# Patient Record
Sex: Female | Born: 1960 | Race: Black or African American | Hispanic: No | Marital: Married
Health system: Southern US, Community
[De-identification: ages and names within clinical notes are randomized; demographics above are authoritative.]

## PROBLEM LIST (undated history)

## (undated) DIAGNOSIS — I10 Essential (primary) hypertension: Secondary | ICD-10-CM

## (undated) HISTORY — PX: BREAST BIOPSY: SHX20

---

## 2004-08-25 ENCOUNTER — Ambulatory Visit: Payer: Self-pay | Admitting: Family Medicine

## 2005-12-20 ENCOUNTER — Ambulatory Visit: Payer: Self-pay | Admitting: Family Medicine

## 2006-12-25 ENCOUNTER — Ambulatory Visit: Payer: Self-pay | Admitting: Family Medicine

## 2006-12-27 ENCOUNTER — Ambulatory Visit: Payer: Self-pay | Admitting: Family Medicine

## 2007-01-11 ENCOUNTER — Ambulatory Visit: Payer: Self-pay | Admitting: General Surgery

## 2007-01-11 ENCOUNTER — Other Ambulatory Visit: Payer: Self-pay

## 2007-01-15 ENCOUNTER — Ambulatory Visit: Payer: Self-pay | Admitting: General Surgery

## 2007-12-28 ENCOUNTER — Ambulatory Visit: Payer: Self-pay | Admitting: Family Medicine

## 2013-03-12 ENCOUNTER — Ambulatory Visit: Payer: Self-pay | Admitting: Family Medicine

## 2014-12-07 ENCOUNTER — Emergency Department
Admit: 2014-12-07 | Disposition: A | Discharge status: Home / Self Care | Payer: Self-pay | Admitting: Emergency Medicine

## 2015-01-16 ENCOUNTER — Ambulatory Visit: Payer: PRIVATE HEALTH INSURANCE | Admitting: Anesthesiology

## 2015-01-16 ENCOUNTER — Encounter: Payer: Self-pay | Admitting: *Deleted

## 2015-01-16 ENCOUNTER — Encounter
Admission: RE | Disposition: A | Discharge status: Home / Self Care | Payer: Self-pay | Source: Ambulatory Visit | Attending: Gastroenterology

## 2015-01-16 ENCOUNTER — Ambulatory Visit
Admission: RE | Admit: 2015-01-16 | Discharge: 2015-01-16 | Disposition: A | Discharge status: Home / Self Care | Payer: PRIVATE HEALTH INSURANCE | Source: Ambulatory Visit | Attending: Gastroenterology | Admitting: Gastroenterology

## 2015-01-16 DIAGNOSIS — K5730 Diverticulosis of large intestine without perforation or abscess without bleeding: Secondary | ICD-10-CM | POA: Insufficient documentation

## 2015-01-16 DIAGNOSIS — Z1211 Encounter for screening for malignant neoplasm of colon: Secondary | ICD-10-CM | POA: Insufficient documentation

## 2015-01-16 DIAGNOSIS — Z79899 Other long term (current) drug therapy: Secondary | ICD-10-CM | POA: Insufficient documentation

## 2015-01-16 DIAGNOSIS — Z791 Long term (current) use of non-steroidal anti-inflammatories (NSAID): Secondary | ICD-10-CM | POA: Insufficient documentation

## 2015-01-16 DIAGNOSIS — D127 Benign neoplasm of rectosigmoid junction: Secondary | ICD-10-CM | POA: Insufficient documentation

## 2015-01-16 DIAGNOSIS — I10 Essential (primary) hypertension: Secondary | ICD-10-CM | POA: Insufficient documentation

## 2015-01-16 HISTORY — PX: COLONOSCOPY: SHX5424

## 2015-01-16 HISTORY — DX: Essential (primary) hypertension: I10

## 2015-01-16 SURGERY — COLONOSCOPY
Anesthesia: General

## 2015-01-16 MED ORDER — MIDAZOLAM HCL 2 MG/2ML IJ SOLN
INTRAMUSCULAR | Status: DC | PRN
  Administered 2015-01-16: 1 mg via INTRAVENOUS

## 2015-01-16 MED ORDER — PROPOFOL INFUSION 10 MG/ML OPTIME
INTRAVENOUS | Status: DC | PRN
  Administered 2015-01-16: 100 ug/kg/min via INTRAVENOUS

## 2015-01-16 MED ORDER — SODIUM CHLORIDE 0.9 % IV SOLN
INTRAVENOUS | Status: DC
  Administered 2015-01-16: 08:00:00 via INTRAVENOUS

## 2015-01-16 MED ORDER — FENTANYL CITRATE (PF) 100 MCG/2ML IJ SOLN
INTRAMUSCULAR | Status: DC | PRN
  Administered 2015-01-16: 50 ug via INTRAVENOUS

## 2015-01-16 MED ORDER — PROPOFOL 10 MG/ML IV BOLUS
INTRAVENOUS | Status: DC | PRN
  Administered 2015-01-16 (×2): 20 mg via INTRAVENOUS

## 2015-01-16 NOTE — H&P (Signed)
  Primary Care Physician:  Petra Kuba, MD  Pre-Procedure History & Physical: HPI:  Savannah Stuart is a 54 y.o. female is here for an colonoscopy.   Past Medical History  Diagnosis Date  . Hypertension     No past surgical history on file.  Prior to Admission medications   Medication Sig Start Date End Date Taking? Authorizing Provider  hydrochlorothiazide (HYDRODIURIL) 25 MG tablet Take 25 mg by mouth daily.   Yes Historical Provider, MD  baclofen (LIORESAL) 10 MG tablet Take 1 tablet by mouth 3 (three) times daily as needed. Shoulder pain 12/07/14   Historical Provider, MD  doxycycline (VIBRAMYCIN) 100 MG capsule  12/27/14   Historical Provider, MD  ibuprofen (ADVIL,MOTRIN) 800 MG tablet Take 1 tablet by mouth 3 (three) times daily as needed. Pain 12/07/14   Historical Provider, MD  ONEXTON 1.2-3.75 % GEL Apply 1 application topically. 12/30/14   Historical Provider, MD    Allergies as of 12/11/2014  . (Not on File)    No family history on file.  History   Social History  . Marital Status: Married    Spouse Name: N/A  . Number of Children: N/A  . Years of Education: N/A   Occupational History  . Not on file.   Social History Main Topics  . Smoking status: Not on file  . Smokeless tobacco: Not on file  . Alcohol Use: Not on file  . Drug Use: Not on file  . Sexual Activity: Not on file   Other Topics Concern  . Not on file   Social History Narrative  . No narrative on file     Physical Exam: BP 126/66 mmHg  Pulse 77  Temp(Src) 96.7 F (35.9 C) (Tympanic)  Resp 16  Ht 5\' 3"  (1.6 m)  Wt 187 lb (84.823 kg)  BMI 33.13 kg/m2  SpO2 99% General:   Alert,  pleasant and cooperative in NAD Head:  Normocephalic and atraumatic. Neck:  Supple; no masses or thyromegaly. Lungs:  Clear throughout to auscultation.    Heart:  Regular rate and rhythm. Abdomen:  Soft, nontender and nondistended. Normal bowel sounds, without guarding, and without rebound.    Neurologic:  Alert and  oriented x4;  grossly normal neurologically.  Impression/Plan: Savannah Stuart is here for an colonoscopy to be performed for colon cancer prevention  Risks, benefits, limitations, and alternatives regarding  colonoscopy have been reviewed with the patient.  Questions have been answered.  All parties agreeable.   Josefine Class, MD  01/16/2015, 8:04 AM

## 2015-01-16 NOTE — Anesthesia Preprocedure Evaluation (Signed)
Anesthesia Evaluation  Patient identified by MRN, date of birth, ID band Patient awake    Reviewed: Allergy & Precautions, NPO status , Patient's Chart, lab work & pertinent test results  Airway Mallampati: II  TM Distance: >3 FB Neck ROM: Full    Dental  (+) Teeth Intact   Pulmonary          Cardiovascular hypertension, Pt. on medications     Neuro/Psych    GI/Hepatic   Endo/Other    Renal/GU      Musculoskeletal   Abdominal   Peds  Hematology   Anesthesia Other Findings   Reproductive/Obstetrics                             Anesthesia Physical Anesthesia Plan  ASA: II  Anesthesia Plan: General   Post-op Pain Management:    Induction: Intravenous  Airway Management Planned: Nasal Cannula  Additional Equipment:   Intra-op Plan:   Post-operative Plan:   Informed Consent: I have reviewed the patients History and Physical, chart, labs and discussed the procedure including the risks, benefits and alternatives for the proposed anesthesia with the patient or authorized representative who has indicated his/her understanding and acceptance.     Plan Discussed with:   Anesthesia Plan Comments:         Anesthesia Quick Evaluation

## 2015-01-16 NOTE — Op Note (Signed)
[facility] Patient Name: Savannah Stuart Procedure Date: 01/16/2015 8:07 AM MRN: [id number] Account #: [id number] Date of Birth: 1960-09-26 Admit Type: Outpatient Age: 54 Room: [facility] ENDO ROOM 1 Gender: Female Note Status: Finalized Procedure:         Colonoscopy Indications:       Screening for colorectal malignant neoplasm, This is the                     patient's first colonoscopy Patient Profile:   This is a 54 year old female. Providers:         Gerrit Heck. Rayann Heman, MD Referring MD:      Tressia Danas, MD (Referring MD) Medicines:         Propofol per Anesthesia Complications:     No immediate complications. Procedure:         Pre-Anesthesia Assessment:                    - Prior to the procedure, a History and Physical was                     performed, and patient medications and allergies were                     reviewed. The patient is competent. The risks and benefits                     of the procedure and the sedation options and risks were                     discussed with the patient. All questions were answered                     and informed consent was obtained. Patient identification                     and proposed procedure were verified by the physician and                     the nurse in the pre-procedure area. Mental Status                     Examination: alert and oriented. Airway Examination:                     normal oropharyngeal airway and neck mobility. Respiratory                     Examination: clear to auscultation. CV Examination: RRR,                     no murmurs, no S3 or S4. Prophylactic Antibiotics: The                     patient does not require prophylactic antibiotics. Prior                     Anticoagulants: The patient has taken no previous                     anticoagulant or antiplatelet agents. ASA Grade                     Assessment: II - A patient with mild systemic disease.  After reviewing the risks and benefits, the patient was                     deemed in satisfactory condition to undergo the procedure.                     The anesthesia plan was to use monitored anesthesia care                     (MAC). Immediately prior to administration of medications,                     the patient was re-assessed for adequacy to receive                     sedatives. The heart rate, respiratory rate, oxygen                     saturations, blood pressure, adequacy of pulmonary                     ventilation, and response to care were monitored                     throughout the procedure. The physical status of the                     patient was re-assessed after the procedure.                    - Prior to the procedure, a History and Physical was                     performed, and patient medications, allergies and                     sensitivities were reviewed. The patient's tolerance of                     previous anesthesia was reviewed.                    After obtaining informed consent, the colonoscope was                     passed under direct vision. Throughout the procedure, the                     patient's blood pressure, pulse, and oxygen saturations                     were monitored continuously. The Colonoscope was                     introduced through the anus and advanced to the the                     terminal ileum. The colonoscopy was performed without                     difficulty. The patient tolerated the procedure well. The                     quality of the bowel preparation was excellent. Findings:      The perianal and digital rectal examinations were normal.      A few  large-mouthed diverticula were found in the sigmoid colon.      A 2 mm polyp was found in the recto-sigmoid colon. The polyp was       sessile. The polyp was removed with a jumbo cold forceps. Resection and       retrieval were complete.      The exam was  otherwise without abnormality on direct and retroflexion       views. Impression:        - Diverticulosis in the sigmoid colon.                    - One 2 mm polyp at the recto-sigmoid colon. Resected and                     retrieved.                    - The examination was otherwise normal on direct and                     retroflexion views. Recommendation:    - Observe patient in GI recovery unit.                    - High fiber diet.                    - Continue present medications.                    - Await pathology results.                    - Repeat colonoscopy for screening / surveillance based on                     pathology results, 5 years if adenomatous, otherwise                     repeat in 10 years.                    - The findings and recommendations were discussed with the                     patient.                    - Return to referring physician.                    - The findings and recommendations were discussed with the                     patient.                    - The findings and recommendations were discussed with the                     patient's family. Procedure Code(s): --- Professional ---                    [phone], Colonoscopy, flexible; with biopsy, single or                     multiple CPT copyright 2014 American Medical Association. All rights reserved. The codes documented in this report are preliminary and upon coder review may  be revised to  meet current compliance requirements. Mellody Life, MD 01/16/2015 8:34:34 AM This report has been signed electronically. Number of Addenda: 0 Note Initiated On: 01/16/2015 8:07 AM Scope Withdrawal Time: 0 hours 11 minutes 8 seconds  Total Procedure Duration: 0 hours 15 minutes 23 seconds       [facility]

## 2015-01-16 NOTE — Anesthesia Postprocedure Evaluation (Signed)
  Anesthesia Post-op Note  Patient: Savannah Stuart  Procedure(s) Performed: Procedure(s): COLONOSCOPY (N/A)  Anesthesia type:General  Patient location: PACU  Post pain: Pain level controlled  Post assessment: Post-op Vital signs reviewed, Patient's Cardiovascular Status Stable, Respiratory Function Stable, Patent Airway and No signs of Nausea or vomiting  Post vital signs: Reviewed and stable  Last Vitals:  Filed Vitals:   01/16/15 0844  BP:   Pulse:   Temp: 36 C  Resp:     Level of consciousness: awake, alert  and patient cooperative  Complications: No apparent anesthesia complications

## 2015-01-16 NOTE — Transfer of Care (Signed)
Immediate Anesthesia Transfer of Care Note  Patient: Savannah Stuart  Procedure(s) Performed: Procedure(s): COLONOSCOPY (N/A)  Patient Location: PACU  Anesthesia Type:General  Level of Consciousness: awake, alert  and oriented  Airway & Oxygen Therapy: Patient Spontanous Breathing  Post-op Assessment: Report given to RN  Post vital signs: Reviewed and stable  Last Vitals:  Filed Vitals:   01/16/15 0745  BP: 126/66  Pulse: 77  Temp: 35.9 C  Resp: 16    Complications: No apparent anesthesia complications

## 2015-01-16 NOTE — Discharge Instructions (Signed)

## 2015-01-20 ENCOUNTER — Other Ambulatory Visit: Payer: Self-pay | Admitting: Family Medicine

## 2015-01-20 DIAGNOSIS — Z1231 Encounter for screening mammogram for malignant neoplasm of breast: Secondary | ICD-10-CM

## 2015-01-20 LAB — SURGICAL PATHOLOGY

## 2015-01-27 ENCOUNTER — Encounter: Payer: Self-pay | Admitting: Gastroenterology

## 2015-02-12 ENCOUNTER — Ambulatory Visit
Admission: RE | Admit: 2015-02-12 | Discharge: 2015-02-12 | Disposition: A | Discharge status: Home / Self Care | Payer: PRIVATE HEALTH INSURANCE | Source: Ambulatory Visit | Attending: Family Medicine | Admitting: Family Medicine

## 2015-02-12 DIAGNOSIS — Z1231 Encounter for screening mammogram for malignant neoplasm of breast: Secondary | ICD-10-CM | POA: Diagnosis present

## 2016-03-02 ENCOUNTER — Other Ambulatory Visit: Payer: Self-pay | Admitting: Family Medicine

## 2016-03-02 DIAGNOSIS — Z1231 Encounter for screening mammogram for malignant neoplasm of breast: Secondary | ICD-10-CM

## 2016-03-17 ENCOUNTER — Ambulatory Visit
Admission: RE | Admit: 2016-03-17 | Discharge: 2016-03-17 | Disposition: A | Discharge status: Home / Self Care | Payer: PRIVATE HEALTH INSURANCE | Source: Ambulatory Visit | Attending: Family Medicine | Admitting: Family Medicine

## 2016-03-17 DIAGNOSIS — Z1231 Encounter for screening mammogram for malignant neoplasm of breast: Secondary | ICD-10-CM | POA: Diagnosis not present

## 2017-01-23 IMAGING — CR DG SHOULDER 3+V*R*
1 series · 3 of 3 positions shown · non-contrast
Comparison: None.

CLINICAL DATA: 54-year-old female with pain and decreased range of
motion. Initial encounter.

EXAM:
DG SHOULDER 3+ VIEWS RIGHT

[Series 1: dxr shoulder right complete · 0.14mm/px · 3 of 3 slices shown]
[im 1/3]
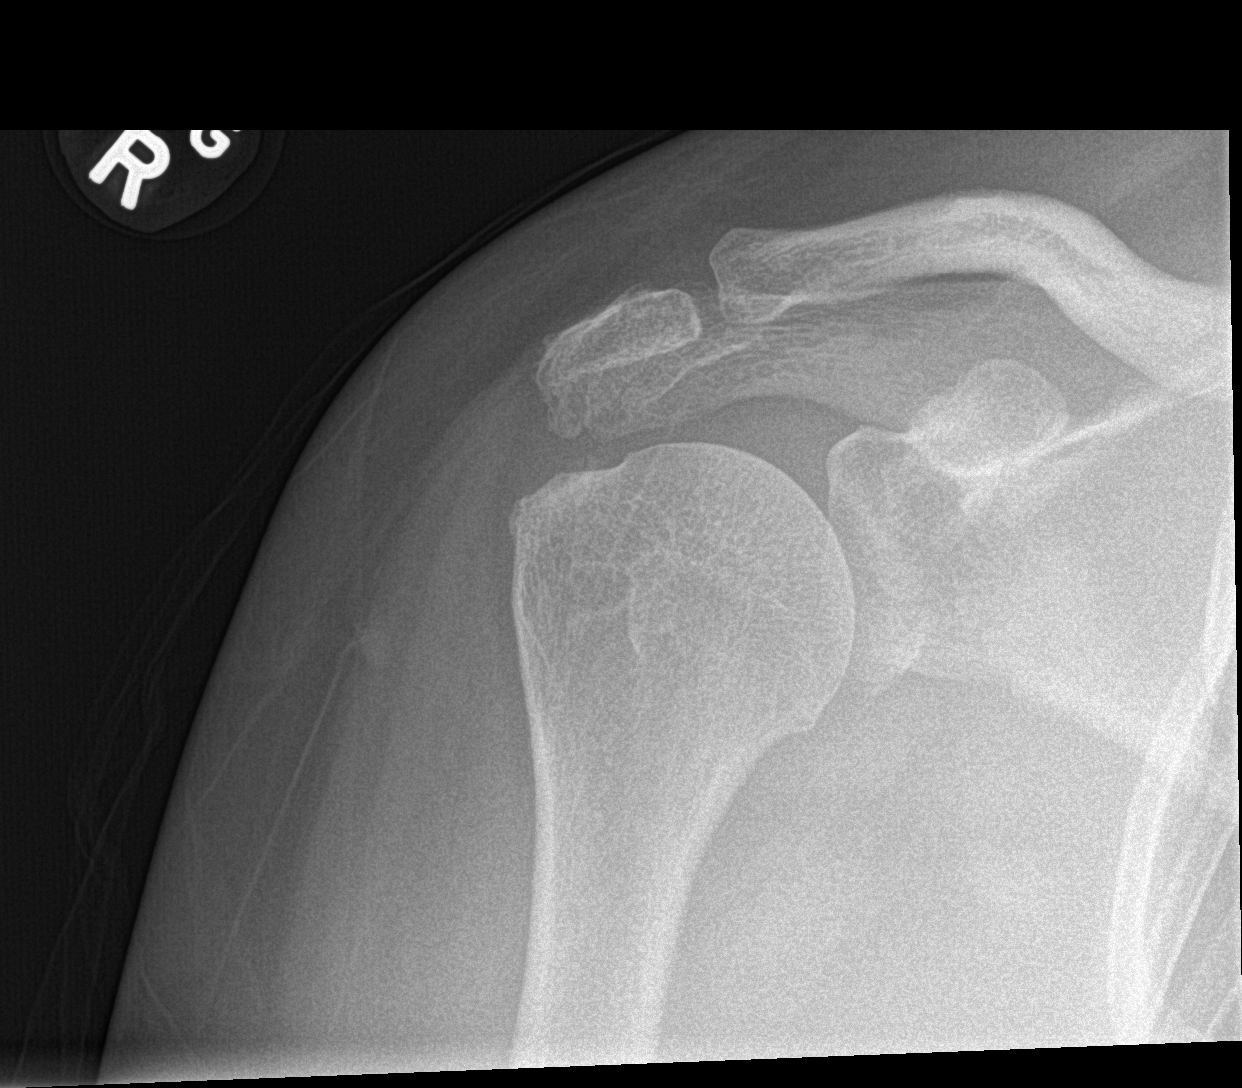
[im 2/3]
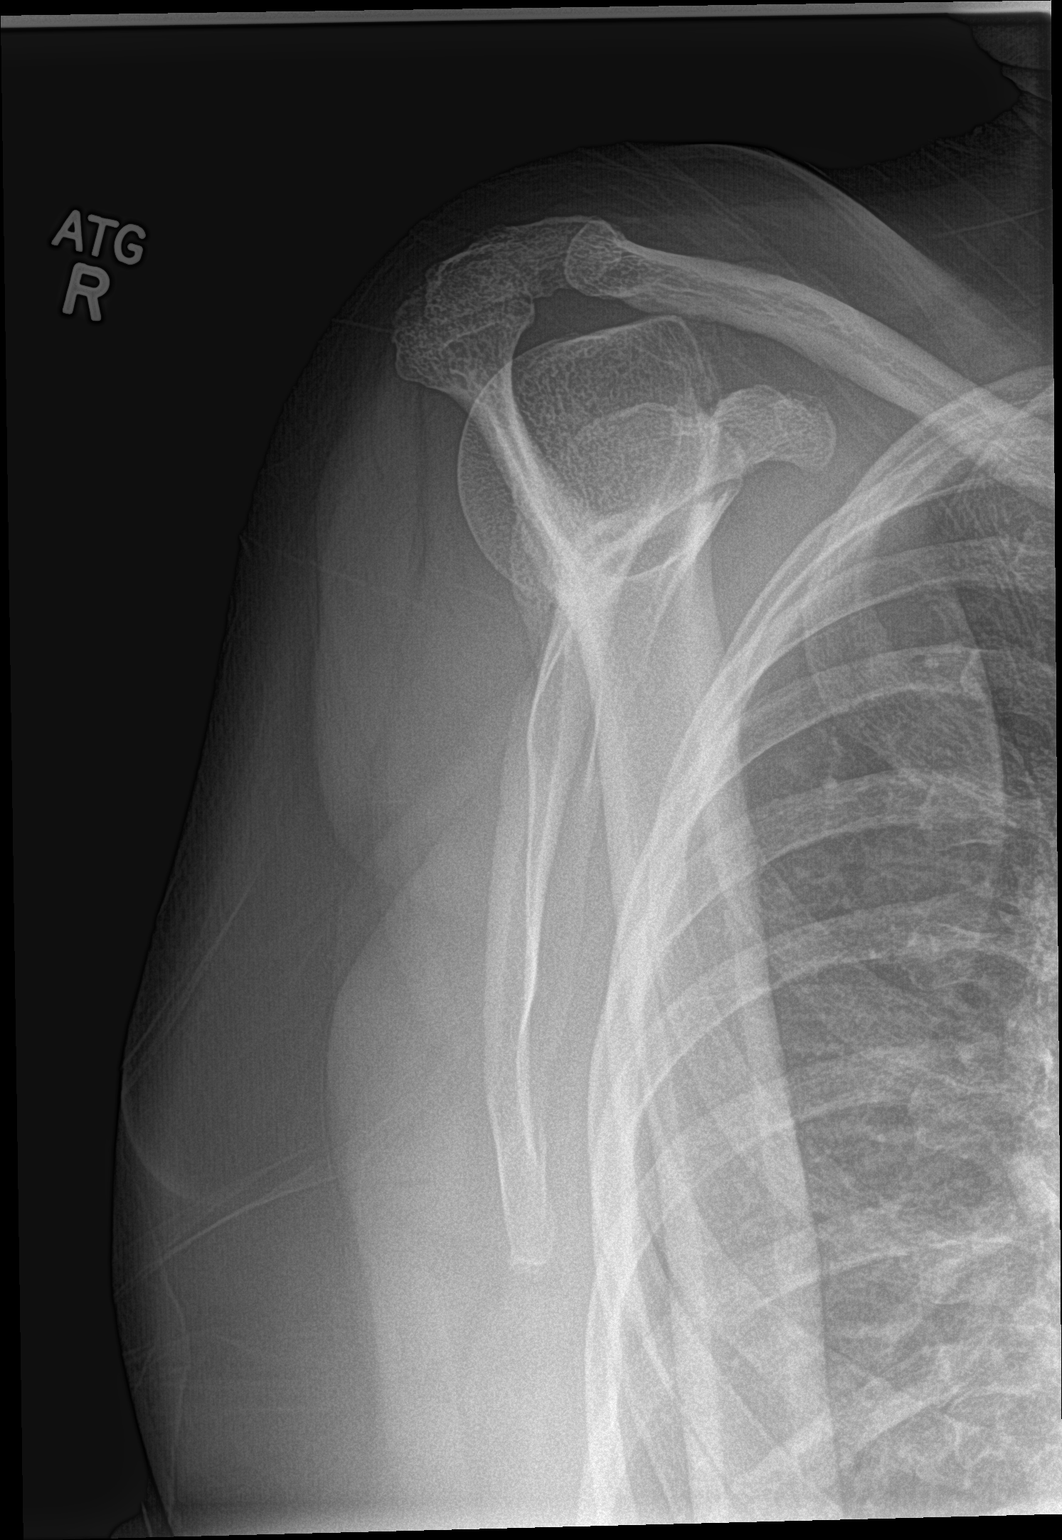
[im 3/3]
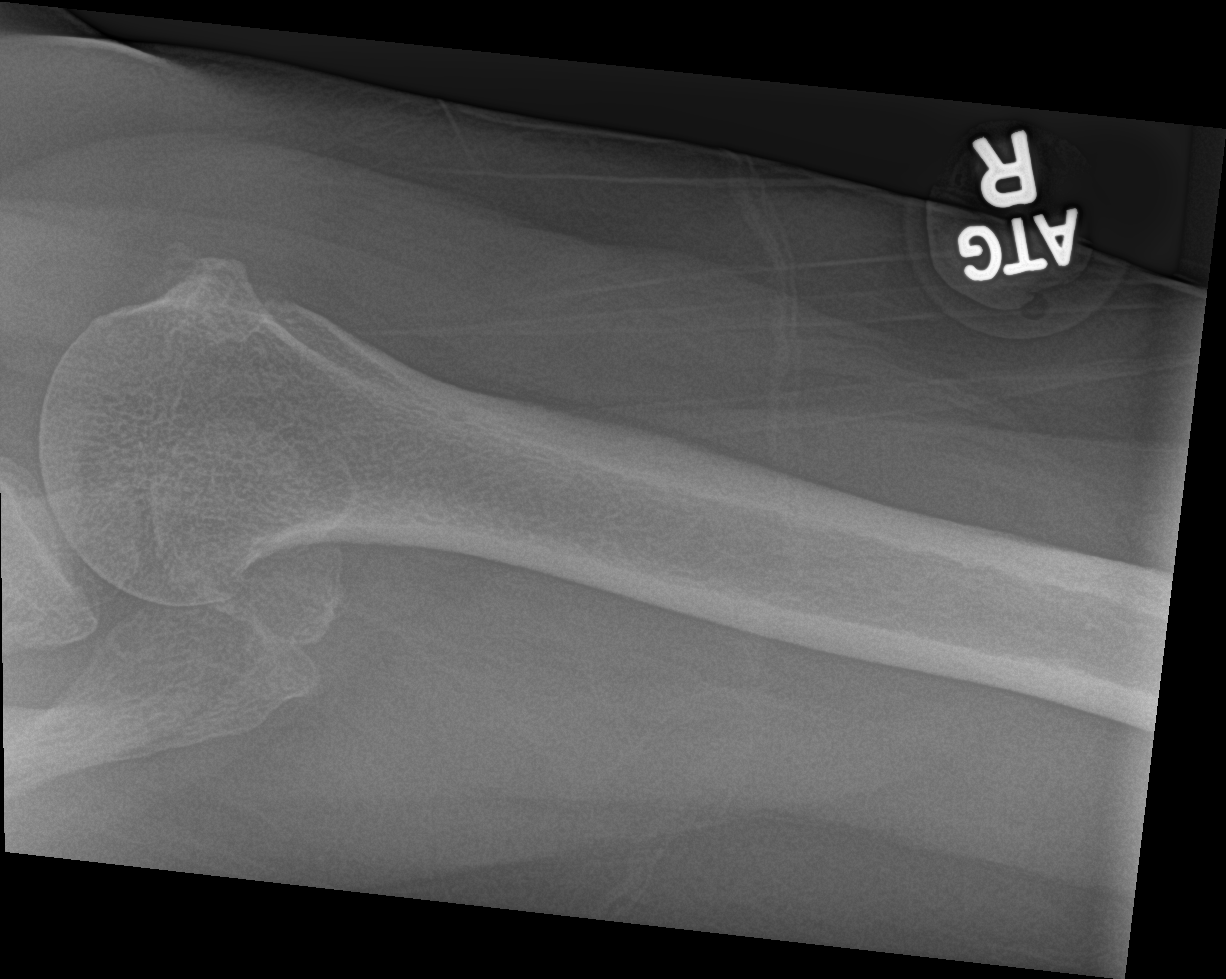

[3 of 3 positions shown; findings below may reference images not displayed]

FINDINGS: No glenohumeral joint dislocation. Proximal right humerus intact.
Visible right clavicle and scapula appear intact. Degenerative mild
cortical irregularity in dystrophic calcification at the rotator
cuff insertion. Visible right ribs and lung parenchyma within normal
limits.
IMPRESSION: Degenerative changes. No acute osseous abnormality identified about
the right shoulder.

## 2017-02-02 ENCOUNTER — Other Ambulatory Visit: Payer: Self-pay | Admitting: Family Medicine

## 2017-02-02 DIAGNOSIS — Z1231 Encounter for screening mammogram for malignant neoplasm of breast: Secondary | ICD-10-CM

## 2017-03-20 ENCOUNTER — Ambulatory Visit
Admission: RE | Admit: 2017-03-20 | Discharge: 2017-03-20 | Disposition: A | Discharge status: Home / Self Care | Payer: PRIVATE HEALTH INSURANCE | Source: Ambulatory Visit | Attending: Family Medicine | Admitting: Family Medicine

## 2017-03-20 DIAGNOSIS — Z1231 Encounter for screening mammogram for malignant neoplasm of breast: Secondary | ICD-10-CM | POA: Diagnosis present

## 2017-10-06 ENCOUNTER — Encounter: Payer: Self-pay | Admitting: General Surgery

## 2018-05-07 ENCOUNTER — Other Ambulatory Visit: Payer: Self-pay | Admitting: Internal Medicine

## 2018-05-07 DIAGNOSIS — M65949 Unspecified synovitis and tenosynovitis, unspecified hand: Secondary | ICD-10-CM

## 2018-05-07 DIAGNOSIS — M659 Synovitis and tenosynovitis, unspecified: Secondary | ICD-10-CM

## 2018-05-12 ENCOUNTER — Ambulatory Visit
Admission: RE | Admit: 2018-05-12 | Discharge: 2018-05-12 | Disposition: A | Discharge status: Home / Self Care | Payer: Self-pay | Source: Ambulatory Visit | Attending: Internal Medicine | Admitting: Internal Medicine

## 2018-05-12 DIAGNOSIS — M659 Synovitis and tenosynovitis, unspecified: Secondary | ICD-10-CM | POA: Insufficient documentation

## 2018-05-12 MED ORDER — GADOBENATE DIMEGLUMINE 529 MG/ML IV SOLN
17.0000 mL | Freq: Once | INTRAVENOUS | Status: AC | PRN
  Administered 2018-05-12: 17 mL via INTRAVENOUS

## 2018-06-19 ENCOUNTER — Ambulatory Visit: Payer: Self-pay | Attending: Internal Medicine | Admitting: Occupational Therapy

## 2018-06-19 ENCOUNTER — Other Ambulatory Visit: Payer: Self-pay

## 2018-06-19 DIAGNOSIS — M25631 Stiffness of right wrist, not elsewhere classified: Secondary | ICD-10-CM | POA: Insufficient documentation

## 2018-06-19 DIAGNOSIS — M25531 Pain in right wrist: Secondary | ICD-10-CM | POA: Insufficient documentation

## 2018-06-19 DIAGNOSIS — R600 Localized edema: Secondary | ICD-10-CM | POA: Insufficient documentation

## 2018-06-19 DIAGNOSIS — M25641 Stiffness of right hand, not elsewhere classified: Secondary | ICD-10-CM | POA: Insufficient documentation

## 2018-06-19 DIAGNOSIS — M79641 Pain in right hand: Secondary | ICD-10-CM | POA: Insufficient documentation

## 2018-06-19 NOTE — Patient Instructions (Signed)
Contrast   fitted isotoner glove - wear at much as she can   Tapping of digits extention  Tendon glides  Block - each one and composite to 4 cm foam block  10 reps Opposition - alternate digits and pick up 2 cm foam piece - 5 reps  Each    AAROM for RD, UD  AAROM for wrist flexion , extention over armrest  10 reps  each  3 x day   And use hand in grasping light objects under 1 lbs - using all digits - not only thumb and 2nd digit

## 2018-06-19 NOTE — Therapy (Signed)
[facility] [address], [place], [address] Phone: [phone]   Fax:  [phone]  Occupational Therapy Evaluation  Patient Details  Name: Savannah Stuart MRN: [id number] Date of Birth: 03-19-1961 Referring Provider (OT): Ardine Bjork Date: 06/19/2018  OT End of Session - 06/19/18 1928    Visit Number  1    Number of Visits  8    Date for OT Re-Evaluation  07/17/18    OT Start Time  1001    OT Stop Time  1059    OT Time Calculation (min)  58 min    Activity Tolerance  Patient tolerated treatment well    Behavior During Therapy  [facility] for tasks assessed/performed       Past Medical History:  Diagnosis Date  . Hypertension     Past Surgical History:  Procedure Laterality Date  . BREAST BIOPSY Left    benign  . COLONOSCOPY N/A 01/16/2015   Procedure: COLONOSCOPY;  Surgeon: Josefine Class, MD;  Location: [facility] ENDOSCOPY;  Service: Endoscopy;  Laterality: N/A;    There were no vitals filed for this visit.  Subjective Assessment - 06/19/18 1915    Subjective   My hand swelling , pain and stiffness happend in June - and did not get better with anything yet - my swelling actually are better today -has some numbness on the top of my hand , in fingers - like a pins and needles     Pertinent History  Savannah Stuart is a 56 y.o. female who presents to [facility] for follow up of Savannah Stuart.     Patient Stated Goals  I would like to get the swelling, pain and motion better -so I can grip , carry , pick up objects, cut food, open cans, fix my hair, turn my car key and change gear,    Currently in Pain?  Yes    Pain Score  --   at rest now 0 - but increase to 10   Pain Location  Hand    Pain Orientation  Right    Pain Descriptors / Indicators  Pins and needles;Throbbing    Pain Type  Chronic pain    Pain Onset  More than a month ago    Pain Frequency  Constant         [facility] OT Assessment - 06/19/18 0001      Assessment   Medical Diagnosis  chronic tenosynovitis R hand     Referring Provider (OT)  Meda Coffee    Onset Date/Surgical Date  01/27/18    Hand Dominance  Left      Prior Function   Vocation  On disability    Leisure  Do house work , cook some , watch tv       AROM   Right Wrist Extension  52 Degrees    Right Wrist Flexion  45 Degrees    Right Wrist Radial Deviation  22 Degrees    Right Wrist Ulnar Deviation  17 Degrees    Left Wrist Extension  70 Degrees    Left Wrist Flexion  75 Degrees    Left Wrist Radial Deviation  29 Degrees    Left Wrist Ulnar Deviation  40 Degrees      Strength   Right Hand Grip (lbs)  5    Right Hand Lateral Pinch  5 lbs    Right Hand 3 Point Pinch  2 lbs  Left Hand Grip (lbs)  30    Left Hand Lateral Pinch  11 lbs    Left Hand 3 Point Pinch  10 lbs      Right Hand AROM   R Thumb MCP 0-60  50 Degrees    R Thumb IP 0-80  70 Degrees    R Thumb Opposition to Index  --   Opposition lag 3 cm to 3rd , 4cm to 4th    R Index  MCP 0-90  50 Degrees    R Index PIP 0-100  50 Degrees    R Long  MCP 0-90  45 Degrees    R Long PIP 0-100  40 Degrees    R Ring  MCP 0-90  40 Degrees    R Ring PIP 0-100  30 Degrees    R Little  MCP 0-90  40 Degrees    R Little PIP 0-100  20 Degrees        fluidotherapy - AROM for digits in all planes -and wrist AROM - to decrease stiffness , decrease pain and edema   Review HEP - hand out provided    Contrast   fitted isotoner glove - wear at much as she can   Tapping of digits extention  Tendon glides  Block - each one and composite to 4 cm foam block  10 reps Opposition - alternate digits and pick up 2 cm foam piece - 5 reps  Each    AAROM for RD, UD  AAROM for wrist flexion , extention over armrest  10 reps  each  3 x day   And use hand in grasping light objects under 1 lbs - using all digits - not only thumb and 2nd digit              OT  Education - 06/19/18 1927    Education Details  findings of eval and HEP     Person(s) Educated  Patient    Methods  Explanation;Demonstration;Handout    Comprehension  Verbalized understanding;Returned demonstration;Need further instruction       OT Short Term Goals - 06/19/18 1937      OT SHORT TERM GOAL #1   Title  Pain on PRWHE improve with more than 25 points     Baseline  at eval Pain on PRWHE 50/50 per pt     Time  3    Period  Weeks    Status  New    Target Date  07/10/18      OT SHORT TERM GOAL #2   Title  Pt to be independent in HEP to decrease pain , increase AROM in digits and wrist to start using composite fist in ADL's     Baseline  no knowledge of HEP     Time  2    Period  Weeks    Status  New    Target Date  07/03/18        OT Long Term Goals - 06/19/18 1938      OT LONG TERM GOAL #1   Title  Pt R hand wrrist AROM improve to [facility] to wipe table and tuck shirt in     Baseline  see flowsheet for AROM     Time  4    Status  New    Target Date  07/17/18      OT LONG TERM GOAL #2   Title  R hand digits improve for pt to touch palm to hold  on to gear in car , grip steering wheel , cut food , carry 1 inch object     Baseline  see flowsheet - severe MC flexion and PIP flexion     Time  4    Period  Weeks    Status  New    Target Date  07/17/18      OT LONG TERM GOAL #3   Title  upgrade HEP for strenghtening and [place] when indicated     Baseline  increase   ROM and edema and pain to decrease first     Time  4    Period  Weeks    Status  New    Target Date  07/17/18            Plan - 06/19/18 1930    Clinical Impression Statement   Pt present at OT eval with persistant and chronic tenosynovitis in R hand - per pt her edema is better today - but pt show severe stiffness in R hand digits flexion - 3rd thru 5th the worse - pt show increase dema and pain - and decrease strenght -pt  show increase AROM at end of session -and discuss with pt in lenght - that  she had increase edema and pain - and ROM and strength decrease - with that decrease functional use- pt was since June using only thumb and 2nd digit- pt report some numbness and pins and needles on dorsal hand and fingers -  pt was encourage to use whole hand in light grasping tasks - in combination witih HEP  - pt can benefit from OT services to increase ROM     Occupational performance deficits (Please refer to evaluation for details):  ADL's;IADL's;Rest and Sleep;Play;Leisure    Current Impairments/barriers affecting progress:  chronic stiffness and pain and edema since June     OT Frequency  2x / week    OT Duration  4 weeks       Patient will benefit from skilled therapeutic intervention in order to improve the following deficits and impairments:  Pain, Impaired flexibility, Increased edema, Decreased knowledge of use of DME, Impaired sensation, Impaired UE functional use, Decreased strength, Decreased range of motion, Decreased coordination  Visit Diagnosis: Localized edema - Plan: Ot plan of care cert/re-cert  Pain in right hand - Plan: Ot plan of care cert/re-cert  Pain in right wrist - Plan: Ot plan of care cert/re-cert  Stiffness of right hand, not elsewhere classified - Plan: Ot plan of care cert/re-cert  Stiffness of right wrist, not elsewhere classified - Plan: Ot plan of care cert/re-cert    Problem List There are no active problems to display for this patient.   Rosalyn Gess OTR/L,CLT 06/19/2018, 7:45 PM  [facility] [address], [place], [address] Phone: [phone]   Fax:  [phone]  Name: Savannah Stuart MRN: [id number] Date of Birth: April 17, 1961

## 2018-06-26 ENCOUNTER — Ambulatory Visit: Payer: Self-pay | Admitting: Occupational Therapy

## 2018-06-26 DIAGNOSIS — M25531 Pain in right wrist: Secondary | ICD-10-CM

## 2018-06-26 DIAGNOSIS — M25641 Stiffness of right hand, not elsewhere classified: Secondary | ICD-10-CM

## 2018-06-26 DIAGNOSIS — R600 Localized edema: Secondary | ICD-10-CM

## 2018-06-26 DIAGNOSIS — M79641 Pain in right hand: Secondary | ICD-10-CM

## 2018-06-26 DIAGNOSIS — M25631 Stiffness of right wrist, not elsewhere classified: Secondary | ICD-10-CM

## 2018-06-26 NOTE — Therapy (Signed)
[place] [facility] [address], [place], [address] Phone: [phone]   Fax:  [phone]  Occupational Therapy Treatment  Patient Details  Name: Kristel Durkee MRN: [id number] Date of Birth: 21-Dec-1960 Referring Provider (OT): Ardine Bjork Date: 06/26/2018  OT End of Session - 06/26/18 1740    Visit Number  2    Number of Visits  8    Date for OT Re-Evaluation  07/17/18    OT Start Time  1145    OT Stop Time  1234    OT Time Calculation (min)  49 min    Activity Tolerance  Patient tolerated treatment well    Behavior During Therapy  [facility] for tasks assessed/performed       Past Medical History:  Diagnosis Date  . Hypertension     Past Surgical History:  Procedure Laterality Date  . BREAST BIOPSY Left    benign  . COLONOSCOPY N/A 01/16/2015   Procedure: COLONOSCOPY;  Surgeon: Josefine Class, MD;  Location: [facility] ENDOSCOPY;  Service: Endoscopy;  Laterality: N/A;    There were no vitals filed for this visit.  Subjective Assessment - 06/26/18 1733    Subjective   I did my exercises like you told me and tried to use it int light act - my pain and swelling is better- but stiffness and range still not good - why don't they have me on medicine for my arthritis     Patient Stated Goals  I would like to get the swelling, pain and motion better -so I can grip , carry , pick up objects, cut food, open cans, fix my hair, turn my car key and change gear,    Currently in Pain?  --   1-5/10 with use, and PROM 10/10    Pain Location  Hand    Pain Orientation  Right    Pain Descriptors / Indicators  Tightness;Aching;Throbbing;Pins and needles    Pain Onset  More than a month ago         [facility] OT Assessment - 06/26/18 0001      Right Hand AROM   R Thumb Opposition to Index  --   Opposition to 2nd and 3rd - middle phalagfor 4th ,and 5th    R Index  MCP 0-90  70 Degrees    R Index PIP 0-100  80 Degrees    R Long  MCP 0-90  70 Degrees    R Long PIP 0-100  45 Degrees    R Ring  MCP 0-90  55 Degrees    R Ring PIP 0-100  35 Degrees    R Little  MCP 0-90  50 Degrees    R Little PIP 0-100  35 Degrees       assess progress in AROM in R hand digits  edema decrease 'and pain at rest 0/10   but with ROM 5-10/10  While pt in fluidotherapy - doing AROM in all planes for digits and wrist   10 min prior to PROM for digits - composite and add to HEP  And fabricated flexion glove - pt done 3 x in clinic 30 sec  With great progress  ed on use at home 4 x day  And followed by grasping cylinder object - of 4 -5 cm                  OT Education - 06/26/18 1737    Education Details  HEP and use of flexion glove     Person(s) Educated  Patient    Methods  Explanation;Demonstration;Handout    Comprehension  Verbalized understanding;Returned demonstration;Need further instruction       OT Short Term Goals - 06/19/18 1937      OT SHORT TERM GOAL #1   Title  Pain on PRWHE improve with more than 25 points     Baseline  at eval Pain on PRWHE 50/50 per pt     Time  3    Period  Weeks    Status  New    Target Date  07/10/18      OT SHORT TERM GOAL #2   Title  Pt to be independent in HEP to decrease pain , increase AROM in digits and wrist to start using composite fist in ADL's     Baseline  no knowledge of HEP     Time  2    Period  Weeks    Status  New    Target Date  07/03/18        OT Long Term Goals - 06/19/18 1938      OT LONG TERM GOAL #1   Title  Pt R hand wrrist AROM improve to [facility] to wipe table and tuck shirt in     Baseline  see flowsheet for AROM     Time  4    Status  New    Target Date  07/17/18      OT LONG TERM GOAL #2   Title  R hand digits improve for pt to touch palm to hold on to gear in car , grip steering wheel , cut food , carry 1 inch object     Baseline  see flowsheet - severe MC flexion and PIP flexion     Time  4    Period  Weeks    Status  New     Target Date  07/17/18      OT LONG TERM GOAL #3   Title  upgrade HEP for strenghtening and [place] when indicated     Baseline  increase   ROM and edema and pain to decrease first     Time  4    Period  Weeks    Status  New    Target Date  07/17/18            Plan - 06/26/18 1741    Clinical Impression Statement  Pt made great progress this past week in [facility] flexion more than PIP's - pt report and show decrease edema and decrease pain at rest - but severe stiffness at PIP's of 3rd thru 5th with increase pain with ROM - fabricate this date flexion glove for pt to use at home to increase ROM -and  incourage use and grasping of cylinder objects to use PIP 's flexion     Occupational performance deficits (Please refer to evaluation for details):  ADL's;IADL's;Rest and Sleep;Play;Leisure    Rehab Potential  Fair    Current Impairments/barriers affecting progress:  chronic stiffness and pain and edema since June     OT Frequency  2x / week    OT Duration  4 weeks    OT Treatment/Interventions  Self-care/ADL training;Therapeutic exercise;Splinting;Patient/family education;Paraffin;Fluidtherapy;Passive range of motion;Manual Therapy;Contrast Bath    Plan  assess progress with flexion glove use and assess need for [facility] block splint     Clinical Decision Making  Multiple treatment options, significant modification of task necessary  OT Home Exercise Plan  see pt instruction     Consulted and Agree with Plan of Care  Patient       Patient will benefit from skilled therapeutic intervention in order to improve the following deficits and impairments:  Pain, Impaired flexibility, Increased edema, Decreased knowledge of use of DME, Impaired sensation, Impaired UE functional use, Decreased strength, Decreased range of motion, Decreased coordination  Visit Diagnosis: Localized edema  Pain in right hand  Pain in right wrist  Stiffness of right hand, not elsewhere classified  Stiffness of right  wrist, not elsewhere classified    Problem List There are no active problems to display for this patient.   Rosalyn Gess OTR/L,CLT 06/26/2018, 5:44 PM  [place] [facility] [address] [place], [address] Phone: [phone]   Fax:  [phone]  Name: Maverick Patman MRN: [id number] Date of Birth: 07-Jul-1961

## 2018-06-26 NOTE — Patient Instructions (Signed)
Cont contrast  PROM composite flexion each digit  10 reps Flexion glove for 4-5 x 30 sec  Grasp around 5 inch object - work on composite fist around  4 x day at least

## 2018-06-28 ENCOUNTER — Ambulatory Visit: Payer: Self-pay | Admitting: Occupational Therapy

## 2018-06-28 DIAGNOSIS — M25531 Pain in right wrist: Secondary | ICD-10-CM

## 2018-06-28 DIAGNOSIS — M79641 Pain in right hand: Secondary | ICD-10-CM

## 2018-06-28 DIAGNOSIS — R600 Localized edema: Secondary | ICD-10-CM

## 2018-06-28 DIAGNOSIS — M25641 Stiffness of right hand, not elsewhere classified: Secondary | ICD-10-CM

## 2018-06-28 DIAGNOSIS — M25631 Stiffness of right wrist, not elsewhere classified: Secondary | ICD-10-CM

## 2018-06-28 NOTE — Patient Instructions (Signed)
Same HEP  

## 2018-06-28 NOTE — Therapy (Signed)
[place] [facility] [address] [place], [address] Phone: [phone]   Fax:  [phone]  Occupational Therapy Treatment  Patient Details  Name: Savannah Stuart MRN: [id number] Date of Birth: 07-20-1961 Referring Provider (OT): Ardine Bjork Date: 06/28/2018  OT End of Session - 06/28/18 1017    Visit Number  3    Number of Visits  8    Date for OT Re-Evaluation  07/17/18    OT Start Time  1006    OT Stop Time  1045    OT Time Calculation (min)  39 min    Activity Tolerance  Patient tolerated treatment well    Behavior During Therapy  [facility] for tasks assessed/performed       Past Medical History:  Diagnosis Date  . Hypertension     Past Surgical History:  Procedure Laterality Date  . BREAST BIOPSY Left    benign  . COLONOSCOPY N/A 01/16/2015   Procedure: COLONOSCOPY;  Surgeon: Josefine Class, MD;  Location: [facility] ENDOSCOPY;  Service: Endoscopy;  Laterality: N/A;    There were no vitals filed for this visit.  Subjective Assessment - 06/28/18 1013    Subjective   While driving today and tried to grasp - did do the exercises and the flexion glove - pain only when stretching the fingers  -I could touch yesterday all my finger tips with my thumb    Patient Stated Goals  I would like to get the swelling, pain and motion better -so I can grip , carry , pick up objects, cut food, open cans, fix my hair, turn my car key and change gear,    Currently in Pain?  No/denies         [facility] OT Assessment - 06/28/18 0001      Right Hand AROM   R Index  MCP 0-90  80 Degrees    R Index PIP 0-100  84 Degrees    R Long  MCP 0-90  76 Degrees    R Long PIP 0-100  50 Degrees    R Ring  MCP 0-90  60 Degrees    R Ring PIP 0-100  45 Degrees    R Little  MCP 0-90  60 Degrees    R Little PIP 0-100  60 Degrees       assess AROM of digits - see flowsheet- cont to make good progress          OT  Treatments/Exercises (OP) - 06/28/18 0001      RUE Fluidotherapy   Number Minutes Fluidotherapy  10 Minutes    RUE Fluidotherapy Location  Hand;Wrist    Comments  AROM for digits and wrist at [facility] to increase ROM       Soft tissue mobs done to volar wrist and forearm with graston tool nr 2 for sweeping and brushing over volar hand and digits - prior to ROM  Soft tissue mobs to lateral bands of PIP's - with joint mobs and distraction   PROM to DIP 's and PIP's , composite flexion PROM  Flexion glove done 3 x 30 sec  need mod A to keep wrist neutral  AROM around 5 cm object and place and hold  And towel rolls - to encourage composite flexion of digits  10 reps  add to HEP         OT Education - 06/28/18 1017    Education Details  HEP and use  of flexion glove again -     Person(s) Educated  Patient    Methods  Explanation;Demonstration;Handout    Comprehension  Verbalized understanding;Returned demonstration;Need further instruction       OT Short Term Goals - 06/19/18 1937      OT SHORT TERM GOAL #1   Title  Pain on PRWHE improve with more than 25 points     Baseline  at eval Pain on PRWHE 50/50 per pt     Time  3    Period  Weeks    Status  New    Target Date  07/10/18      OT SHORT TERM GOAL #2   Title  Pt to be independent in HEP to decrease pain , increase AROM in digits and wrist to start using composite fist in ADL's     Baseline  no knowledge of HEP     Time  2    Period  Weeks    Status  New    Target Date  07/03/18        OT Long Term Goals - 06/19/18 1938      OT LONG TERM GOAL #1   Title  Pt R hand wrrist AROM improve to [facility] to wipe table and tuck shirt in     Baseline  see flowsheet for AROM     Time  4    Status  New    Target Date  07/17/18      OT LONG TERM GOAL #2   Title  R hand digits improve for pt to touch palm to hold on to gear in car , grip steering wheel , cut food , carry 1 inch object     Baseline  see flowsheet - severe MC  flexion and PIP flexion     Time  4    Period  Weeks    Status  New    Target Date  07/17/18      OT LONG TERM GOAL #3   Title  upgrade HEP for strenghtening and [place] when indicated     Baseline  increase   ROM and edema and pain to decrease first     Time  4    Period  Weeks    Status  New    Target Date  07/17/18            Plan - 06/28/18 1018    Clinical Impression Statement  Pt cont to make progress in AROM in MC's and PIP's  flexion - still painfull with PROM -and extreme tightness in PIP's of 3rd thru 5th - with 4th the worse - pt to work on fuctional use of composite flexion of digits during day too     Occupational performance deficits (Please refer to evaluation for details):  ADL's;IADL's;Rest and Sleep;Play;Leisure    Rehab Potential  Fair    Current Impairments/barriers affecting progress:  chronic stiffness and pain and edema since June     OT Frequency  2x / week    OT Duration  4 weeks    OT Treatment/Interventions  Self-care/ADL training;Therapeutic exercise;Splinting;Patient/family education;Paraffin;Fluidtherapy;Passive range of motion;Manual Therapy;Contrast Bath    Plan  assess progress and if need to add [facility] block splint     Clinical Decision Making  Multiple treatment options, significant modification of task necessary    OT Home Exercise Plan  see pt instruction     Consulted and Agree with Plan of Care  Patient  Patient will benefit from skilled therapeutic intervention in order to improve the following deficits and impairments:  Pain, Impaired flexibility, Increased edema, Decreased knowledge of use of DME, Impaired sensation, Impaired UE functional use, Decreased strength, Decreased range of motion, Decreased coordination  Visit Diagnosis: Localized edema  Pain in right hand  Pain in right wrist  Stiffness of right hand, not elsewhere classified  Stiffness of right wrist, not elsewhere classified    Problem List There are no active  problems to display for this patient.   Rosalyn Gess  OTR/L,CLT 06/28/2018, 11:14 AM  [place] [facility] [address] [place], [address] Phone: [phone]   Fax:  [phone]  Name: Savannah Stuart MRN: [id number] Date of Birth: Jul 17, 1961

## 2018-07-03 ENCOUNTER — Ambulatory Visit: Payer: Self-pay | Attending: Internal Medicine | Admitting: Occupational Therapy

## 2018-07-03 DIAGNOSIS — M25631 Stiffness of right wrist, not elsewhere classified: Secondary | ICD-10-CM | POA: Insufficient documentation

## 2018-07-03 DIAGNOSIS — R600 Localized edema: Secondary | ICD-10-CM | POA: Insufficient documentation

## 2018-07-03 DIAGNOSIS — M25531 Pain in right wrist: Secondary | ICD-10-CM | POA: Insufficient documentation

## 2018-07-03 DIAGNOSIS — M79641 Pain in right hand: Secondary | ICD-10-CM | POA: Insufficient documentation

## 2018-07-03 DIAGNOSIS — M25641 Stiffness of right hand, not elsewhere classified: Secondary | ICD-10-CM | POA: Insufficient documentation

## 2018-07-03 NOTE — Therapy (Signed)
[place] [facility] [address] [place], [address] Phone: [phone]   Fax:  [phone]  Occupational Therapy Treatment  Patient Details  Name: Savannah Stuart MRN: [id number] Date of Birth: Apr 16, 1961 Referring Provider (OT): Ardine Bjork Date: 07/03/2018  OT End of Session - 07/03/18 1357    Visit Number  4    Number of Visits  8    Date for OT Re-Evaluation  07/17/18    OT Start Time  1138    OT Stop Time  1226    OT Time Calculation (min)  48 min    Activity Tolerance  Patient tolerated treatment well    Behavior During Therapy  [facility] for tasks assessed/performed       Past Medical History:  Diagnosis Date  . Hypertension     Past Surgical History:  Procedure Laterality Date  . BREAST BIOPSY Left    benign  . COLONOSCOPY N/A 01/16/2015   Procedure: COLONOSCOPY;  Surgeon: Josefine Class, MD;  Location: [facility] ENDOSCOPY;  Service: Endoscopy;  Laterality: N/A;    There were no vitals filed for this visit.  Subjective Assessment - 07/03/18 1350    Subjective   I done exercises - but my fingers still stiff -I try and use it more -  did I wait to long before coming to therapy     Patient Stated Goals  I would like to get the swelling, pain and motion better -so I can grip , carry , pick up objects, cut food, open cans, fix my hair, turn my car key and change gear,    Currently in Pain?  No/denies         [facility] OT Assessment - 07/03/18 0001      Right Hand AROM   R Index  MCP 0-90  70 Degrees  (Pended)     R Index PIP 0-100  80 Degrees  (Pended)     R Long  MCP 0-90  80 Degrees  (Pended)     R Long PIP 0-100  60 Degrees  (Pended)     R Ring  MCP 0-90  55 Degrees  (Pended)     R Ring PIP 0-100  45 Degrees  (Pended)     R Little  MCP 0-90  60 Degrees  (Pended)        Pt 's AROM about the same this date - except 3rd diigt - edema and pain decrease - but stiffness cont in PIP's  This date  done Paraffin prior to stretches and use of flexion glove         OT Treatments/Exercises (OP) - 07/03/18 0001      RUE Paraffin   Number Minutes Paraffin  10 Minutes    RUE Paraffin Location  Hand    Comments  at [facility] and attempt last 4-5 min flexion stretch      Fabricate this date MC block splint to use in combination with flexion glove to increase PIP's flexion stretch   Pt did need pull only to proximal palm - could not do pull to wrist   3 x 1 min  At then AROM in MC block splint  Flexion glove by itself down to wrist  3 x 1 min  Then AROM around 5 cm cylinder - place and hold   MC block splint by itself - grasping larger objects and pt ed on using it for functional use and grasping using  all digits PIP's flexion    increase to 5 x day flexion glove use        OT Education - 07/03/18 1356    Education Details  add MC block splint to flexion glove to increase PIP stretch and to wear during day to increase functional flexion of PIP's     Person(s) Educated  Patient    Methods  Explanation;Demonstration;Handout    Comprehension  Verbalized understanding;Returned demonstration;Need further instruction       OT Short Term Goals - 06/19/18 1937      OT SHORT TERM GOAL #1   Title  Pain on PRWHE improve with more than 25 points     Baseline  at eval Pain on PRWHE 50/50 per pt     Time  3    Period  Weeks    Status  New    Target Date  07/10/18      OT SHORT TERM GOAL #2   Title  Pt to be independent in HEP to decrease pain , increase AROM in digits and wrist to start using composite fist in ADL's     Baseline  no knowledge of HEP     Time  2    Period  Weeks    Status  New    Target Date  07/03/18        OT Long Term Goals - 06/19/18 1938      OT LONG TERM GOAL #1   Title  Pt R hand wrrist AROM improve to [facility] to wipe table and tuck shirt in     Baseline  see flowsheet for AROM     Time  4    Status  New    Target Date  07/17/18      OT LONG TERM  GOAL #2   Title  R hand digits improve for pt to touch palm to hold on to gear in car , grip steering wheel , cut food , carry 1 inch object     Baseline  see flowsheet - severe MC flexion and PIP flexion     Time  4    Period  Weeks    Status  New    Target Date  07/17/18      OT LONG TERM GOAL #3   Title  upgrade HEP for strenghtening and [place] when indicated     Baseline  increase   ROM and edema and pain to decrease first     Time  4    Period  Weeks    Status  New    Target Date  07/17/18            Plan - 07/03/18 1357    Clinical Impression Statement  Pt appear little more stiff this am in digits - compare to last time -but cont to make progress in 3rd coming in and during session - add this date [facility] block splint to flexion glove to increase stretch for PIP flexion and to use during day to increase functiaonal use of PIP flexion during grasping     Occupational performance deficits (Please refer to evaluation for details):  ADL's;IADL's;Rest and Sleep;Play;Leisure    Rehab Potential  Fair    Current Impairments/barriers affecting progress:  chronic stiffness and pain and edema since June     OT Frequency  2x / week    OT Duration  4 weeks    OT Treatment/Interventions  Self-care/ADL training;Therapeutic exercise;Splinting;Patient/family education;Paraffin;Fluidtherapy;Passive range of motion;Manual Therapy;Contrast  Bath    Plan  assess use of MC block splint  and progress in PIP flexion    Clinical Decision Making  Multiple treatment options, significant modification of task necessary    OT Home Exercise Plan  see pt instruction     Consulted and Agree with Plan of Care  Patient       Patient will benefit from skilled therapeutic intervention in order to improve the following deficits and impairments:  Pain, Impaired flexibility, Increased edema, Decreased knowledge of use of DME, Impaired sensation, Impaired UE functional use, Decreased strength, Decreased range of motion,  Decreased coordination  Visit Diagnosis: Localized edema  Pain in right hand  Pain in right wrist  Stiffness of right hand, not elsewhere classified  Stiffness of right wrist, not elsewhere classified    Problem List There are no active problems to display for this patient.   Rosalyn Gess OTR/L,CLT 07/03/2018, 2:00 PM  [place] [facility] [address], [place], [address] Phone: [phone]   Fax:  [phone]  Name: Savannah Stuart MRN: [id number] Date of Birth: Aug 11, 1961

## 2018-07-03 NOTE — Patient Instructions (Signed)
Pt to increase her HEP to 5 x day - with flexion glove - add MC block splint to increase stretch at PIP - for flexion  3 x 1 min ,  Then AROM in [place] block splint  Then flexion glove full fist  And then full fist place and hold around 5 cm cylinder   Pt to wear her MC block splint more during day - and increase functional use with using PIP flexion to grasp large objects  And cont with other HEP same

## 2018-07-05 ENCOUNTER — Ambulatory Visit: Payer: Self-pay | Admitting: Occupational Therapy

## 2018-07-05 DIAGNOSIS — M25531 Pain in right wrist: Secondary | ICD-10-CM

## 2018-07-05 DIAGNOSIS — M79641 Pain in right hand: Secondary | ICD-10-CM

## 2018-07-05 DIAGNOSIS — M25631 Stiffness of right wrist, not elsewhere classified: Secondary | ICD-10-CM

## 2018-07-05 DIAGNOSIS — R600 Localized edema: Secondary | ICD-10-CM

## 2018-07-05 DIAGNOSIS — M25641 Stiffness of right hand, not elsewhere classified: Secondary | ICD-10-CM

## 2018-07-05 NOTE — Patient Instructions (Signed)
Same HEP  

## 2018-07-05 NOTE — Therapy (Signed)
[place] [facility] [address] [place], [address] Phone: [phone]   Fax:  [phone]  Occupational Therapy Treatment  Patient Details  Name: Savannah Stuart MRN: [id number] Date of Birth: Nov 13, 1960 Referring Provider (OT): Ardine Bjork Date: 07/05/2018  OT End of Session - 07/05/18 1336    Visit Number  5    Number of Visits  8    Date for OT Re-Evaluation  07/17/18    OT Start Time  1301    OT Stop Time  1345    OT Time Calculation (min)  44 min    Activity Tolerance  Patient tolerated treatment well    Behavior During Therapy  [facility] for tasks assessed/performed       Past Medical History:  Diagnosis Date  . Hypertension     Past Surgical History:  Procedure Laterality Date  . BREAST BIOPSY Left    benign  . COLONOSCOPY N/A 01/16/2015   Procedure: COLONOSCOPY;  Surgeon: Josefine Class, MD;  Location: [facility] ENDOSCOPY;  Service: Endoscopy;  Laterality: N/A;    There were no vitals filed for this visit.  Subjective Assessment - 07/05/18 1334    Subjective   I done the exercises 5 x a day since you seen me - and wore the blue splint to use the tips of my fingers and use all of the fingers and not the only index     Patient Stated Goals  I would like to get the swelling, pain and motion better -so I can grip , carry , pick up objects, cut food, open cans, fix my hair, turn my car key and change gear,    Currently in Pain?  No/denies   pain with exercises         [facility] OT Assessment - 07/05/18 0001      Right Hand AROM   R Index  MCP 0-90  75 Degrees    R Index PIP 0-100  80 Degrees    R Long  MCP 0-90  75 Degrees    R Long PIP 0-100  65 Degrees    R Ring  MCP 0-90  60 Degrees    R Ring PIP 0-100  50 Degrees    R Little  MCP 0-90  50 Degrees    R Little PIP 0-100  65 Degrees       assess AROM - some progress again this session in PIP's and MC's - cont to have edema and pain with PROM    but doing contrast and wearing compression glove         OT Treatments/Exercises (OP) - 07/05/18 0001      RUE Paraffin   Number Minutes Paraffin  10 Minutes    RUE Paraffin Location  Hand    Comments  at [facility] with flexion stretch in digits      Soft tissue massage to lateral bands of PIP's and joint mobs to MC's and gentle traction to joints - pain free - tender at some joints  MC and carpal spreads  With AAROM for wrist flexion and extention      MC block splint used combination with flexion glove to increase PIP's flexion stretch   Pt  need pull only to proximal palm - could not do pull to wrist   3 x 1 min  At then AROM in [place] block splint  Flexion glove by itself down to wrist for composite  3  x 1 min  Then AROM around 5 cm cylinder - place and hold Can now do 2 cm foam block after 5 cm cylinder - showed increase 2nd and 3rd digit flexion    MC block splint by itself - grasping larger objects and pt ed on using it for functional use and grasping using all digits PIP's flexion   rolling towel with and without MC block splint    increase to 5 x day flexion glove use        OT Education - 07/05/18 1335    Education Details  add MC block splint to flexion glove to increase PIP stretch and to wear during day to increase functional flexion of PIP's     Person(s) Educated  Patient    Methods  Explanation;Demonstration;Handout    Comprehension  Verbalized understanding;Returned demonstration;Need further instruction       OT Short Term Goals - 06/19/18 1937      OT SHORT TERM GOAL #1   Title  Pain on PRWHE improve with more than 25 points     Baseline  at eval Pain on PRWHE 50/50 per pt     Time  3    Period  Weeks    Status  New    Target Date  07/10/18      OT SHORT TERM GOAL #2   Title  Pt to be independent in HEP to decrease pain , increase AROM in digits and wrist to start using composite fist in ADL's     Baseline  no knowledge of HEP     Time  2     Period  Weeks    Status  New    Target Date  07/03/18        OT Long Term Goals - 06/19/18 1938      OT LONG TERM GOAL #1   Title  Pt R hand wrrist AROM improve to [facility] to wipe table and tuck shirt in     Baseline  see flowsheet for AROM     Time  4    Status  New    Target Date  07/17/18      OT LONG TERM GOAL #2   Title  R hand digits improve for pt to touch palm to hold on to gear in car , grip steering wheel , cut food , carry 1 inch object     Baseline  see flowsheet - severe MC flexion and PIP flexion     Time  4    Period  Weeks    Status  New    Target Date  07/17/18      OT LONG TERM GOAL #3   Title  upgrade HEP for strenghtening and [place] when indicated     Baseline  increase   ROM and edema and pain to decrease first     Time  4    Period  Weeks    Status  New    Target Date  07/17/18            Plan - 07/05/18 1719    Clinical Impression Statement  Pt this date arrive in clinic with some PIP flexion in digits - and she used flexion glove and MC block splint  apart and in combination - 5 x a day since last viisit - pt to use hand  with MC block splint several times during day to encourage more PIP flexion that will increase her composite flexion  Occupational performance deficits (Please refer to evaluation for details):  ADL's;IADL's;Rest and Sleep;Play;Leisure    Rehab Potential  Fair    Current Impairments/barriers affecting progress:  chronic stiffness and pain and edema since June     OT Frequency  1x / week    OT Duration  4 weeks    OT Treatment/Interventions  Self-care/ADL training;Therapeutic exercise;Splinting;Patient/family education;Paraffin;Fluidtherapy;Passive range of motion;Manual Therapy;Contrast Bath    Plan  assess use of MC block splint  use and progress in PIP flexion and composite     Clinical Decision Making  Multiple treatment options, significant modification of task necessary    OT Home Exercise Plan  see pt instruction      Consulted and Agree with Plan of Care  Patient       Patient will benefit from skilled therapeutic intervention in order to improve the following deficits and impairments:  Pain, Impaired flexibility, Increased edema, Decreased knowledge of use of DME, Impaired sensation, Impaired UE functional use, Decreased strength, Decreased range of motion, Decreased coordination  Visit Diagnosis: Localized edema  Pain in right hand  Pain in right wrist  Stiffness of right hand, not elsewhere classified  Stiffness of right wrist, not elsewhere classified    Problem List There are no active problems to display for this patient.   Rosalyn Gess OTR/L,CLT 07/05/2018, 5:22 PM  [place] [facility] [address], [place], [address] Phone: [phone]   Fax:  [phone]  Name: Savannah Stuart MRN: [id number] Date of Birth: [birth year]

## 2018-07-10 ENCOUNTER — Ambulatory Visit: Payer: Self-pay | Admitting: Occupational Therapy

## 2018-07-10 DIAGNOSIS — M79641 Pain in right hand: Secondary | ICD-10-CM

## 2018-07-10 DIAGNOSIS — M25641 Stiffness of right hand, not elsewhere classified: Secondary | ICD-10-CM

## 2018-07-10 DIAGNOSIS — M25531 Pain in right wrist: Secondary | ICD-10-CM

## 2018-07-10 DIAGNOSIS — R600 Localized edema: Secondary | ICD-10-CM

## 2018-07-10 DIAGNOSIS — M25631 Stiffness of right wrist, not elsewhere classified: Secondary | ICD-10-CM

## 2018-07-10 NOTE — Patient Instructions (Signed)
Same HEP but try and increase flexor stretch - less pull but longer for 5 min at time   Add this date hand gripper at 5 lbs for 2 x 10 reps  3 x day  After done stretches  BUT ED NOT TO OVERDO GRIPPER - will have increase pain and edema

## 2018-07-10 NOTE — Therapy (Signed)
[place] [facility] [address], [place], [address] Phone: [phone]   Fax:  [phone]  Occupational Therapy Treatment  Patient Details  Name: Savannah Stuart MRN: [id number] Date of Birth: [birth year] Referring Provider (OT): Ardine Bjork Date: 07/10/2018  OT End of Session - 07/10/18 1404    Visit Number  6    Number of Visits  8    Date for OT Re-Evaluation  07/17/18    OT Start Time  1203    OT Stop Time  1250    OT Time Calculation (min)  47 min    Activity Tolerance  Patient tolerated treatment well    Behavior During Therapy  [facility] for tasks assessed/performed       Past Medical History:  Diagnosis Date  . Hypertension     Past Surgical History:  Procedure Laterality Date  . BREAST BIOPSY Left    benign  . COLONOSCOPY N/A 01/16/2015   Procedure: COLONOSCOPY;  Surgeon: Josefine Class, MD;  Location: [facility] ENDOSCOPY;  Service: Endoscopy;  Laterality: N/A;    There were no vitals filed for this visit.  Subjective Assessment - 07/10/18 1401    Subjective   You think it is better- I am seeing Dr Meda Coffee next Monday - feel like there are still some swelling in the palm and not pain sitting here - but index finger knuckle hurting some - trying to use it more     Patient Stated Goals  I would like to get the swelling, pain and motion better -so I can grip , carry , pick up objects, cut food, open cans, fix my hair, turn my car key and change gear,    Currently in Pain?  --   pain with PROM and stretches         [facility] OT Assessment - 07/10/18 0001      Strength   Right Hand Grip (lbs)  8    Right Hand Lateral Pinch  6 lbs    Right Hand 3 Point Pinch  6 lbs    Left Hand Grip (lbs)  30    Left Hand Lateral Pinch  11 lbs    Left Hand 3 Point Pinch  10 lbs      Right Hand AROM   R Thumb MCP 0-60  50 Degrees    R Thumb IP 0-80  90 Degrees    R Index  MCP 0-90  80 Degrees    R Index PIP 0-100   80 Degrees    R Long  MCP 0-90  80 Degrees    R Long PIP 0-100  65 Degrees    R Ring  MCP 0-90  60 Degrees    R Ring PIP 0-100  55 Degrees    R Little  MCP 0-90  55 Degrees    R Little PIP 0-100  65 Degrees      assess progress in R hand digits AROM and grip /prehension strength  pt progress from [facility] great - but last session progress smaller and slower - pt pain at end range PROM   pt to use flexion glove longer but less of pull during day    Tender over A1pulley of 2nd digit  And edema over [facility] 's        OT Treatments/Exercises (OP) - 07/10/18 0001      Cryotherapy   Number Minutes Cryotherapy  3 Minutes    Cryotherapy Location  Hand    Type of Cryotherapy  Ice pack      RUE Paraffin   Number Minutes Paraffin  10 Minutes    RUE Paraffin Location  Hand    Comments  at [facility] in gentle flexion stretch - loose fist        Soft tissue massage to lateral bands of PIP's and joint mobs to MC's and gentle traction to joints - pain free - tender at some joints  MC and carpal spreads  Done this date graston tool brushing and sweeping over palm and volar digits prior to ROM      MC block splint used combination with flexion glove to increase PIP's flexion stretch  Pt  need pull only to proximal palm  1 x 5 min   At then AROM in [place] block splint  Flexion glove by itself down to wrist for composite  1 x 5 min  Then AROM around 5 cm cylinder - place and hold- unable to do 4th digit  Hand gripper done and add to HEP - set at wide grip to use PIP's more and at 5 lbs   pt to do at end of session - 3 x day   2 sets of 10   not increase pain -or edema - NOT TO OVER DO IT    MC block splint by itself use - grasping larger objects and pt ed on using it for functional use and grasping using all digits PIP's flexion   rolling towel with and without MC block splint   increase to 5 x day flexion glove use         OT Education - 07/10/18 1404    Education Details   MC  block splint to flexion glove to increase PIP stretch and to wear during day to increase functional flexion of PIP's ; hand gripper use     Person(s) Educated  Patient    Methods  Explanation;Demonstration;Handout    Comprehension  Verbalized understanding;Returned demonstration;Need further instruction       OT Short Term Goals - 06/19/18 1937      OT SHORT TERM GOAL #1   Title  Pain on PRWHE improve with more than 25 points     Baseline  at eval Pain on PRWHE 50/50 per pt     Time  3    Period  Weeks    Status  New    Target Date  07/10/18      OT SHORT TERM GOAL #2   Title  Pt to be independent in HEP to decrease pain , increase AROM in digits and wrist to start using composite fist in ADL's     Baseline  no knowledge of HEP     Time  2    Period  Weeks    Status  New    Target Date  07/03/18        OT Long Term Goals - 06/19/18 1938      OT LONG TERM GOAL #1   Title  Pt R hand wrrist AROM improve to [facility] to wipe table and tuck shirt in     Baseline  see flowsheet for AROM     Time  4    Status  New    Target Date  07/17/18      OT LONG TERM GOAL #2   Title  R hand digits improve for pt to touch palm to hold on to gear in car , grip  steering wheel , cut food , carry 1 inch object     Baseline  see flowsheet - severe MC flexion and PIP flexion     Time  4    Period  Weeks    Status  New    Target Date  07/17/18      OT LONG TERM GOAL #3   Title  upgrade HEP for strenghtening and [place] when indicated     Baseline  increase   ROM and edema and pain to decrease first     Time  4    Period  Weeks    Status  New    Target Date  07/17/18            Plan - 07/10/18 1405    Clinical Impression Statement  Pt cont to show progress but slower and smaller improvements in ROM - pt cont to have increase edema over MC's - and tender this date over A1pulley of 2nd digit - pt to see Dr Meda Coffee on Monday ; ? meds for arthritis or inflammation - pt made great progress from [facility] -  did add hand gripper this date but reinforce for pt not to over do it  - cont contrast and isotoner glove for edema     Occupational performance deficits (Please refer to evaluation for details):  ADL's;IADL's;Rest and Sleep;Play;Leisure    Rehab Potential  Fair    Current Impairments/barriers affecting progress:  chronic stiffness and pain and edema since June     OT Frequency  Biweekly    OT Duration  4 weeks    OT Treatment/Interventions  Self-care/ADL training;Therapeutic exercise;Splinting;Patient/family education;Paraffin;Fluidtherapy;Passive range of motion;Manual Therapy;Contrast Bath    Plan  assess use of MC block splint  use and progress in PIP flexion and composite  - and use of hand gripper     Clinical Decision Making  Multiple treatment options, significant modification of task necessary    OT Home Exercise Plan  see pt instruction     Consulted and Agree with Plan of Care  Patient       Patient will benefit from skilled therapeutic intervention in order to improve the following deficits and impairments:  Pain, Impaired flexibility, Increased edema, Decreased knowledge of use of DME, Impaired sensation, Impaired UE functional use, Decreased strength, Decreased range of motion, Decreased coordination  Visit Diagnosis: Localized edema  Pain in right hand  Pain in right wrist  Stiffness of right hand, not elsewhere classified  Stiffness of right wrist, not elsewhere classified    Problem List There are no active problems to display for this patient.   Rosalyn Gess OTR/L,CLT 07/10/2018, 2:08 PM  [place] [facility] [address], [place], [address] Phone: [phone]   Fax:  [phone]  Name: Lovelyn Sheeran MRN: [id number] Date of Birth: 1961/02/25

## 2018-07-19 ENCOUNTER — Ambulatory Visit: Payer: Self-pay | Admitting: Occupational Therapy

## 2018-07-19 DIAGNOSIS — M25531 Pain in right wrist: Secondary | ICD-10-CM

## 2018-07-19 DIAGNOSIS — M79641 Pain in right hand: Secondary | ICD-10-CM

## 2018-07-19 DIAGNOSIS — R600 Localized edema: Secondary | ICD-10-CM

## 2018-07-19 DIAGNOSIS — M25641 Stiffness of right hand, not elsewhere classified: Secondary | ICD-10-CM

## 2018-07-19 DIAGNOSIS — M25631 Stiffness of right wrist, not elsewhere classified: Secondary | ICD-10-CM

## 2018-07-19 NOTE — Patient Instructions (Signed)
Cont with same HEP  But cont with same stretches and flexion glove After AROM - practice full grasp during ADL's - and gripping around 2 cm cylinder objects  And then gripper keep at 5 lbs - do 10 reps actively  And place and hold 10 reps  gripping at 5 lbs

## 2018-07-19 NOTE — Therapy (Signed)
[place] [facility] [address], [place], [address] Phone: [phone]   Fax:  [phone]  Occupational Therapy Treatment  Patient Details  Name: Savannah Stuart MRN: [id number] Date of Birth: 11-01-1960 Referring Provider (OT): Ardine Bjork Date: 07/19/2018  OT End of Session - 07/19/18 1509    Visit Number  7    Number of Visits  10    Date for OT Re-Evaluation  09/20/18    OT Start Time  1145    OT Stop Time  1250    OT Time Calculation (min)  65 min    Activity Tolerance  Patient tolerated treatment well    Behavior During Therapy  [facility] for tasks assessed/performed       Past Medical History:  Diagnosis Date  . Hypertension     Past Surgical History:  Procedure Laterality Date  . BREAST BIOPSY Left    benign  . COLONOSCOPY N/A 01/16/2015   Procedure: COLONOSCOPY;  Surgeon: Josefine Class, MD;  Location: [facility] ENDOSCOPY;  Service: Endoscopy;  Laterality: N/A;    There were no vitals filed for this visit.  Subjective Assessment - 07/19/18 1443    Subjective   If have seen my family MD and Dr Meda Coffee- my blood sugar was increase again and then I did start Metratrexate yesterday - so I hope that will help - and started eating more healthy - do not want to  take shots for my bloodsugar- Dr Meda Coffee said to take pain meds before seeing you     Patient Stated Goals  I would like to get the swelling, pain and motion better -so I can grip , carry , pick up objects, cut food, open cans, fix my hair, turn my car key and change gear,    Currently in Pain?  No/denies     Per pt she do not have insurance - and worried about money - pt decrease to every 2-3 wks a    [facility] OT Assessment - 07/19/18 0001      Strength   Right Hand 3 Point Pinch  9 lbs      Right Hand AROM   R Index  MCP 0-90  75 Degrees    R Index PIP 0-100  80 Degrees    R Long  MCP 0-90  80 Degrees    R Long PIP 0-100  65 Degrees    R Ring   MCP 0-90  60 Degrees    R Ring PIP 0-100  60 Degrees    R Little  MCP 0-90  60 Degrees    R Little PIP 0-100  65 Degrees       assess AROM - pt cont to make great progress in ROM since [facility] - compare on flow sheet  review progress with pt - because she sees her hand the every day and get discourage Pt pain mostly with PROM and stretches - but edema still over MC's            OT Treatments/Exercises (OP) - 07/19/18 0001      RUE Paraffin   Number Minutes Paraffin  10 Minutes    RUE Paraffin Location  Hand    Comments  [place] flexion wrap gentle all digits while in paraffin at [facility]        Soft tissue massage to lateral bands of PIP's and joint mobs to MC's and gentle traction to joints - pain free - tender  at some joints  MC and carpal spreads  Done this date graston tool brushing and sweeping over palm and volar digits prior to ROM    MC block splint usedcombination with flexion glove to increase PIP's flexion stretch   pull down this wrist this date  1 x 5 min   At then AROM in [place] block splint  Flexion glove by itself down to wristfor composite 1 x 5 min  Then AROM around 5inch cylinder -can do all 4 digits   issues 2 inch object- can do 2nd and 3rd - but not 4th and 5th   Hand gripper done set still at  wide grip to use PIP's more and at 5 lbs  Pt to do 10 -15 reps  And then place and hold 10-15 reps    - 3 x day   2 sets of 10   not increase pain -or edema - NOT TO OVER DO IT   MC block splint by itself use - grasping larger objects and pt ed on using it for functional use and grasping using all digits PIP's flexion rolling towel with and without MC block splint  increase to 5 x day flexion glove use And encourage using hand - all digits  And 2 lbs objects for wrist and elbow exercises         OT Education - 07/19/18 1509    Education Details   MC block splint to flexion glove to increase PIP stretch and to wear during day to increase  functional flexion of PIP's ; hand gripper use     Person(s) Educated  Patient    Methods  Explanation;Demonstration;Handout    Comprehension  Verbalized understanding;Returned demonstration;Need further instruction       OT Short Term Goals - 07/19/18 1747      OT SHORT TERM GOAL #1   Title  Pain on PRWHE improve with more than 25 points     Baseline  at eval Pain on PRWHE 50/50 per pt - pain only with PROM and stretches- will do next time     Time  3    Period  Weeks    Status  On-going    Target Date  08/09/18      OT SHORT TERM GOAL #2   Title  Pt to be independent in HEP to decrease pain , increase AROM in digits and wrist to start using composite fist in ADL's     Status  Achieved        OT Long Term Goals - 07/19/18 1747      OT LONG TERM GOAL #1   Title  Pt R hand wrrist AROM improve to [facility] to wipe table and tuck shirt in     Status  Achieved      OT LONG TERM GOAL #2   Title  R hand digits improve for pt to touch palm to hold on to gear in car , grip steering wheel , cut food , carry 1 inch object     Baseline  Pt can hold 5inch object with all 4 digits , 2inch with 2nd and 3rd - see flowsheet    Time  6    Period  Weeks    Status  On-going    Target Date  08/30/18      OT LONG TERM GOAL #3   Title  upgrade HEP for strenghtening and [place] when indicated     Baseline  did initiate last time gripper  at 5 lbs - but not [facility] yet     Time  6    Period  Weeks    Status  On-going    Target Date  08/30/18            Plan - 07/19/18 1511    Clinical Impression Statement  Pt made great progress in AROM in all digits since [facility] - but cont to have severe stiffness but able to grasp 5inch cylinder object with all digits and 2 inch with 2nd and 3rd - grip strength this date 9 lbs - cont to have increase edema over [facility] 's more than PIP's - and pain with stretches -pt can tolerate flexion glove longer period- issued a smaller cylinder for pt to practice grasping - pt has  handgripper to use and encourage to use all digits for grasping during ADL's and IADL's     Occupational performance deficits (Please refer to evaluation for details):  ADL's;IADL's;Rest and Sleep;Play;Leisure    Rehab Potential  Fair    Current Impairments/barriers affecting progress:  chronic stiffness and pain and edema since June     OT Frequency  --   every 2-3 wks    OT Duration  8 weeks    OT Treatment/Interventions  Self-care/ADL training;Therapeutic exercise;Splinting;Patient/family education;Paraffin;Fluidtherapy;Passive range of motion;Manual Therapy;Contrast Bath    Plan  assess progress with her HEP , gripper -     Clinical Decision Making  Multiple treatment options, significant modification of task necessary    OT Home Exercise Plan  see pt instruction     Consulted and Agree with Plan of Care  Patient       Patient will benefit from skilled therapeutic intervention in order to improve the following deficits and impairments:  Pain, Impaired flexibility, Increased edema, Decreased knowledge of use of DME, Impaired sensation, Impaired UE functional use, Decreased strength, Decreased range of motion, Decreased coordination  Visit Diagnosis: Localized edema - Plan: Ot plan of care cert/re-cert  Pain in right hand - Plan: Ot plan of care cert/re-cert  Pain in right wrist - Plan: Ot plan of care cert/re-cert  Stiffness of right hand, not elsewhere classified - Plan: Ot plan of care cert/re-cert  Stiffness of right wrist, not elsewhere classified - Plan: Ot plan of care cert/re-cert    Problem List There are no active problems to display for this patient.   Rosalyn Gess OTR/L,CLT 07/19/2018, 5:54 PM  [place] [facility] [address] [place], [address] Phone: [phone]   Fax:  [phone]  Name: Savannah Stuart MRN: [id number] Date of Birth: 1961-03-07

## 2018-08-09 ENCOUNTER — Ambulatory Visit: Payer: Self-pay | Admitting: Occupational Therapy

## 2018-08-16 ENCOUNTER — Ambulatory Visit: Payer: Self-pay | Attending: Internal Medicine | Admitting: Occupational Therapy

## 2018-08-16 DIAGNOSIS — M25531 Pain in right wrist: Secondary | ICD-10-CM | POA: Insufficient documentation

## 2018-08-16 DIAGNOSIS — M79641 Pain in right hand: Secondary | ICD-10-CM | POA: Insufficient documentation

## 2018-08-16 DIAGNOSIS — M25631 Stiffness of right wrist, not elsewhere classified: Secondary | ICD-10-CM | POA: Insufficient documentation

## 2018-08-16 DIAGNOSIS — M25641 Stiffness of right hand, not elsewhere classified: Secondary | ICD-10-CM | POA: Insufficient documentation

## 2018-08-16 DIAGNOSIS — R600 Localized edema: Secondary | ICD-10-CM | POA: Insufficient documentation

## 2018-08-16 NOTE — Therapy (Signed)
[facility] [address] [place], [address] Phone: [phone]   Fax:  [phone]  Occupational Therapy Treatment  Patient Details  Name: Savannah Stuart MRN: [id number] Date of Birth: [birth year] Referring Provider (OT): Ardine Bjork Date: 08/16/2018  OT End of Session - 08/16/18 1815    Visit Number  8    Number of Visits  10    Date for OT Re-Evaluation  09/20/18    OT Start Time  7035    OT Stop Time  1447    OT Time Calculation (min)  52 min    Activity Tolerance  Patient tolerated treatment well    Behavior During Therapy  [facility] for tasks assessed/performed       Past Medical History:  Diagnosis Date  . Hypertension     Past Surgical History:  Procedure Laterality Date  . BREAST BIOPSY Left    benign  . COLONOSCOPY N/A 01/16/2015   Procedure: COLONOSCOPY;  Surgeon: Josefine Class, MD;  Location: [facility] ENDOSCOPY;  Service: Endoscopy;  Laterality: N/A;    There were no vitals filed for this visit.  Subjective Assessment - 08/16/18 1808    Subjective   I done my exercises but I don't see any progress- using it - still some swelling over the knuckles     Patient Stated Goals  I would like to get the swelling, pain and motion better -so I can grip , carry , pick up objects, cut food, open cans, fix my hair, turn my car key and change gear,    Currently in Pain?  No/denies         [facility] OT Assessment - 08/16/18 0001      Strength   Right Hand Grip (lbs)  10    Right Hand Lateral Pinch  9 lbs    Right Hand 3 Point Pinch  9 lbs      Right Hand AROM   R Index  MCP 0-90  70 Degrees    R Index PIP 0-100  85 Degrees    R Long  MCP 0-90  80 Degrees    R Long PIP 0-100  75 Degrees    R Ring  MCP 0-90  60 Degrees    R Ring PIP 0-100  60 Degrees    R Little  MCP 0-90  60 Degrees    R Little PIP 0-100  60 Degrees       progress in AROM ta 2nd and 3rd digit some -and grip /prehension  strength increase         OT Treatments/Exercises (OP) - 08/16/18 0001      RUE Paraffin   Number Minutes Paraffin  10 Minutes    RUE Paraffin Location  Hand    Comments  flexion stretch with heatingpad       soft tissue mobs done to 4th and 5th PIP -s and gentle traction with joint mobs prior to ROM -daughter to assist pt with it    Flexion glove - with MC block splint on 3 x 3 min - rubber band change to only 1 for 3rd thru 5th - to increase stretch  Then without MC block 3x 3 min  Place and hold around 2 inch cylinder - flexion done - 4th and 5th no able yet And AROM around 2 inch cylinder - flexion  12 reps each   Hand gripper at 10 lbs increase this date  -  10 reps  and place and hold 10 reps-   and for periods of time wear MC block splint -and use intrinsic fist to pick up 4 m or smaller objects      OT Education - 08/16/18 1814    Education Details  update HEP     Person(s) Educated  Patient    Methods  Explanation;Demonstration;Handout    Comprehension  Verbalized understanding;Returned demonstration;Need further instruction       OT Short Term Goals - 08/16/18 1817      OT SHORT TERM GOAL #1   Title  Pain on PRWHE improve with more than 25 points     Status  Achieved      OT SHORT TERM GOAL #2   Title  Pt to be independent in HEP to decrease pain , increase AROM in digits and wrist to start using composite fist in ADL's     Status  Achieved        OT Long Term Goals - 08/16/18 1818      OT LONG TERM GOAL #1   Title  Pt R hand wrrist AROM improve to [facility] to wipe table and tuck shirt in     Baseline  see flowsheet     Time  4    Status  On-going    Target Date  09/20/18      OT LONG TERM GOAL #2   Baseline  Pt can hold 5inch object with all 4 digits , 2inch with 2nd and 3rd - see flowsheet    Time  4    Period  Weeks    Status  On-going    Target Date  09/20/18      OT LONG TERM GOAL #3   Title  upgrade HEP for strenghtening and [facility] when  indicated     Baseline   gripper at 10 lbs - but not [facility] yet     Time  4    Status  On-going    Target Date  09/20/18            Plan - 08/16/18 1815    Clinical Impression Statement  Pt was doing HEP for month - reassess progress and update HEP again - change rubber bands for more of stretch on flexion glove - pt did improve in AROM at 2nd and 3rd and grip /prehension strength - pt to focus on PIP flexion at [employer] 5th mostly - but other digits too and strength - pt to follow up in month again     Occupational performance deficits (Please refer to evaluation for details):  ADL's;IADL's;Rest and Sleep;Play;Leisure    Rehab Potential  Fair    Current Impairments/barriers affecting progress:  chronic stiffness and pain and edema since June     OT Frequency  Monthly    OT Duration  4 weeks    OT Treatment/Interventions  Self-care/ADL training;Therapeutic exercise;Splinting;Patient/family education;Paraffin;Fluidtherapy;Passive range of motion;Manual Therapy;Contrast Bath    Plan  assess progress with her HEP , gripper -     Clinical Decision Making  Several treatment options, min-mod task modification necessary    OT Home Exercise Plan  see pt instruction     Consulted and Agree with Plan of Care  Patient       Patient will benefit from skilled therapeutic intervention in order to improve the following deficits and impairments:  Pain, Impaired flexibility, Increased edema, Decreased knowledge of use of DME, Impaired sensation, Impaired UE functional use, Decreased strength, Decreased  range of motion, Decreased coordination  Visit Diagnosis: Localized edema  Pain in right hand  Stiffness of right wrist, not elsewhere classified  Stiffness of right hand, not elsewhere classified  Pain in right wrist    Problem List There are no active problems to display for this patient.   Rosalyn Gess OTR/L,CLT 08/16/2018, 6:20 PM  [facility] [address] [place], [address] Phone: [phone]   Fax:  [phone]  Name: Savannah Stuart MRN: [id number] Date of Birth: 09/29/1960

## 2018-08-16 NOTE — Patient Instructions (Signed)
Pt to cont with heat or contrast  Flexion glove - with MC block splint on 3 x 3 min  Then without MC block 3x 3 min  Place and hold around 2 inch cylinder - flexion  And AROM around 2 inch cylinder - flexion  12 reps each   Hand gripper at 10 lbs - 10 reps  and place and hold 10 reps  and for periods of time wear MC block splint -and use intrinsic fist to pick up smaller than 4 cm objects

## 2018-09-18 ENCOUNTER — Ambulatory Visit: Payer: Self-pay | Attending: Internal Medicine | Admitting: Occupational Therapy

## 2018-09-18 DIAGNOSIS — M25641 Stiffness of right hand, not elsewhere classified: Secondary | ICD-10-CM | POA: Insufficient documentation

## 2018-09-18 DIAGNOSIS — M25631 Stiffness of right wrist, not elsewhere classified: Secondary | ICD-10-CM | POA: Insufficient documentation

## 2018-09-18 DIAGNOSIS — M79641 Pain in right hand: Secondary | ICD-10-CM | POA: Insufficient documentation

## 2018-09-18 DIAGNOSIS — R600 Localized edema: Secondary | ICD-10-CM | POA: Insufficient documentation

## 2018-09-18 DIAGNOSIS — M25531 Pain in right wrist: Secondary | ICD-10-CM | POA: Insufficient documentation

## 2018-09-18 NOTE — Patient Instructions (Signed)
REINFORCE AGAIN TO GET PARAFFIN BATH TO USE WITH [PRIOR TO HER FLEXION GLOVE   ,PROM , AROM AND HAND GRIPPER -AND MORE THAN ONCE A DAY

## 2018-09-18 NOTE — Therapy (Signed)
[facility] [facility] [address] [place], [address] Phone: [phone]   Fax:  [phone]  Occupational Therapy Treatment  Patient Details  Name: Savannah Stuart MRN: [id number] Date of Birth: Oct 02, 1960 Referring Provider (OT): Ardine Bjork Date: 09/18/2018  OT End of Session - 09/18/18 1701    Visit Number  9    Number of Visits  10    Date for OT Re-Evaluation  09/20/18    OT Start Time  6269    OT Stop Time  1440    OT Time Calculation (min)  42 min    Activity Tolerance  Patient tolerated treatment well    Behavior During Therapy  [facility] for tasks assessed/performed       Past Medical History:  Diagnosis Date  . Hypertension     Past Surgical History:  Procedure Laterality Date  . BREAST BIOPSY Left    benign  . COLONOSCOPY N/A 01/16/2015   Procedure: COLONOSCOPY;  Surgeon: Josefine Class, MD;  [facility] ENDOSCOPY;  Service: Endoscopy;  Laterality: N/A;    There were no vitals filed for this visit.  Subjective Assessment - 09/18/18 1657    Subjective   I did not see you for month -my fingers are still stiff - and still not able to grip or lift objects - I am left handed - I am doing my exercises about one time every day - but here and there maybe mist one - follow up with Dr Meda Coffee new month     Patient Stated Goals  I would like to get the swelling, pain and motion better -so I can grip , carry , pick up objects, cut food, open cans, fix my hair, turn my car key and change gear,    Currently in Pain?  No/denies    Pain Score  --   only with PROM end range        [facility] OT Assessment - 09/18/18 0001      Strength   Right Hand Grip (lbs)  11    Right Hand Lateral Pinch  9 lbs    Right Hand 3 Point Pinch  10 lbs      Right Hand AROM   R Index  MCP 0-90  60 Degrees    R Index PIP 0-100  80 Degrees    R Long  MCP 0-90  75 Degrees    R Long PIP 0-100  55 Degrees    R Ring  MCP 0-90   55 Degrees    R Ring PIP 0-100  50 Degrees    R Little  MCP 0-90  60 Degrees    R Little PIP 0-100  40 Degrees       meausurements taken -pt decline this date compare to month ago  - pt not doing HEP as frequent - winter  End range of 4th and 5th MC hard this date         OT Treatments/Exercises (OP) - 09/18/18 0001      RUE Paraffin   Number Minutes Paraffin  10 Minutes    RUE Paraffin Location  Hand    Comments  coban flexion wrap to all digits after [facility] to increase ROM           REINFORCE AGAIN TO GET PARAFFIN BATH TO USE WITH [PRIOR TO HER FLEXION GLOVE   ,PROM , AROM AND HAND GRIPPER -AND MORE THAN ONCE A  DAY  Pt verbalize understanding:   Flexion glove - with MC block splint on 3 x 3 min - rubber band increase stretch last time  Then without MC block 3x 3 min  Place and hold around 2 inch cylinder - flexion  - 4th and 5th attempted  And AROM around 2 inch cylinder - flexion  12 reps each   Hand gripper at 10 lbs  still - did maintain her grip strength from month ago  - 10 reps  and place and hold 10 reps-   and for periods of time wear MC block splint -and use intrinsic fist to pick up 4 m or smaller objects     OT Education - 09/18/18 1700    Education Details  REINFORCE TO GET PARAFFIN BATH TO USE WITH HER HEP    Person(s) Educated  Patient    Methods  Explanation;Demonstration;Handout    Comprehension  Verbalized understanding;Returned demonstration;Need further instruction       OT Short Term Goals - 09/18/18 1704      OT SHORT TERM GOAL #1   Title  Pain on PRWHE improve with more than 25 points     Baseline  at eval Pain on PRWHE 50/50 per pt - pain only with PROM and stretches-0/50 this date    Status  Achieved      OT SHORT TERM GOAL #2   Title  Pt to be independent in HEP to decrease pain , increase AROM in digits and wrist to start using composite fist in ADL's     Baseline   knowledge of HEP - need to get paraffin bath     Status  Achieved        OT Long Term Goals - 09/18/18 1705      OT LONG TERM GOAL #1   Title  Pt R hand wrrist AROM improve to [facility] to wipe table and tuck shirt in     Baseline  see flowsheet     Status  Partially Met      OT LONG TERM GOAL #2   Title  R hand digits improve for pt to touch palm to hold on to gear in car , grip steering wheel , cut food , carry 1 inch object     Baseline  Pt can hold 5inch object with all 4 digits , 2inch with 2nd and 3rd - see flowsheet    Status  Partially Met      OT LONG TERM GOAL #3   Title  upgrade HEP for strenghtening and [employer] when indicated     Status  Deferred            Plan - 09/18/18 1701    Clinical Impression Statement   Pt was not seen for month - doing HEP - this date pt show decrease in ROM at all joints - with harder end range for 4th and 5th MC's  - REINORCE for pt again to get paraffin bath to use at home 2-3 x day prior to her flexion glove and ROM - to increase ROM - pt in clinic this date showed inccrease ROM in all joints after using flexion wrap with paraffin - pt to follow up with Dr Meda Coffee next month     Occupational performance deficits (Please refer to evaluation for details):  ADL's;IADL's;Rest and Sleep;Play;Leisure    Rehab Potential  Fair    Current Impairments/barriers affecting progress:  chronic stiffness and pain and edema since June  Plan  pt to see MD next month     OT Home Exercise Plan  see pt instruction     Consulted and Agree with Plan of Care  Patient       Patient will benefit from skilled therapeutic intervention in order to improve the following deficits and impairments:  Pain, Impaired flexibility, Increased edema, Decreased knowledge of use of DME, Impaired sensation, Impaired UE functional use, Decreased strength, Decreased range of motion, Decreased coordination  Visit Diagnosis: Localized edema  Pain in right hand  Stiffness of right wrist, not elsewhere classified  Stiffness of right  hand, not elsewhere classified  Pain in right wrist    Problem List There are no active problems to display for this patient.   Rosalyn Gess OTR/L,CLT  09/18/2018, 5:07 PM  [facility] [address] [place], [address] Phone: [phone]   Fax:  [phone]  Name: Savannah Stuart MRN: [id number] Date of Birth: 1961-06-10

## 2019-09-19 ENCOUNTER — Other Ambulatory Visit: Payer: Self-pay | Admitting: Obstetrics and Gynecology

## 2019-09-19 ENCOUNTER — Other Ambulatory Visit: Payer: Self-pay

## 2019-09-30 ENCOUNTER — Other Ambulatory Visit: Payer: Self-pay | Admitting: Obstetrics and Gynecology

## 2019-09-30 ENCOUNTER — Other Ambulatory Visit: Payer: Self-pay

## 2019-09-30 DIAGNOSIS — Z1231 Encounter for screening mammogram for malignant neoplasm of breast: Secondary | ICD-10-CM

## 2019-10-24 ENCOUNTER — Ambulatory Visit
Admission: RE | Admit: 2019-10-24 | Discharge: 2019-10-24 | Disposition: A | Discharge status: Home / Self Care | Payer: BC Managed Care – PPO | Source: Ambulatory Visit | Attending: Obstetrics and Gynecology | Admitting: Obstetrics and Gynecology

## 2019-10-24 DIAGNOSIS — Z1231 Encounter for screening mammogram for malignant neoplasm of breast: Secondary | ICD-10-CM | POA: Diagnosis present

## 2020-09-23 ENCOUNTER — Other Ambulatory Visit: Payer: Self-pay | Admitting: Family Medicine

## 2020-09-23 DIAGNOSIS — Z1231 Encounter for screening mammogram for malignant neoplasm of breast: Secondary | ICD-10-CM

## 2020-10-26 ENCOUNTER — Other Ambulatory Visit: Payer: Self-pay

## 2020-10-26 ENCOUNTER — Ambulatory Visit
Admission: RE | Admit: 2020-10-26 | Discharge: 2020-10-26 | Disposition: A | Discharge status: Home / Self Care | Payer: BC Managed Care – PPO | Source: Ambulatory Visit | Attending: Family Medicine | Admitting: Family Medicine

## 2020-10-26 DIAGNOSIS — Z1231 Encounter for screening mammogram for malignant neoplasm of breast: Secondary | ICD-10-CM | POA: Insufficient documentation

## 2021-10-04 ENCOUNTER — Other Ambulatory Visit: Payer: Self-pay | Admitting: Obstetrics

## 2021-10-04 DIAGNOSIS — Z1231 Encounter for screening mammogram for malignant neoplasm of breast: Secondary | ICD-10-CM

## 2021-11-10 ENCOUNTER — Ambulatory Visit: Payer: Medicare Other

## 2021-12-08 ENCOUNTER — Ambulatory Visit
Admission: RE | Admit: 2021-12-08 | Discharge: 2021-12-08 | Disposition: A | Discharge status: Home / Self Care | Payer: BC Managed Care – PPO | Source: Ambulatory Visit | Attending: Obstetrics | Admitting: Obstetrics

## 2021-12-08 DIAGNOSIS — Z1231 Encounter for screening mammogram for malignant neoplasm of breast: Secondary | ICD-10-CM | POA: Diagnosis present

## 2022-11-17 ENCOUNTER — Other Ambulatory Visit: Payer: Self-pay | Admitting: Obstetrics

## 2022-11-17 DIAGNOSIS — Z1231 Encounter for screening mammogram for malignant neoplasm of breast: Secondary | ICD-10-CM

## 2023-01-30 ENCOUNTER — Ambulatory Visit
Admission: RE | Admit: 2023-01-30 | Discharge: 2023-01-30 | Disposition: A | Discharge status: Home / Self Care | Payer: BC Managed Care – PPO | Source: Ambulatory Visit | Attending: Obstetrics | Admitting: Obstetrics

## 2023-01-30 DIAGNOSIS — Z1231 Encounter for screening mammogram for malignant neoplasm of breast: Secondary | ICD-10-CM

## 2024-01-08 ENCOUNTER — Other Ambulatory Visit: Payer: Self-pay | Admitting: Internal Medicine

## 2024-01-08 ENCOUNTER — Encounter: Payer: Self-pay | Admitting: Medical Oncology

## 2024-01-08 DIAGNOSIS — Z1231 Encounter for screening mammogram for malignant neoplasm of breast: Secondary | ICD-10-CM

## 2024-02-21 ENCOUNTER — Ambulatory Visit
Admission: RE | Admit: 2024-02-21 | Discharge: 2024-02-21 | Disposition: A | Discharge status: Home / Self Care | Source: Ambulatory Visit | Attending: Internal Medicine | Admitting: Internal Medicine

## 2024-02-21 DIAGNOSIS — Z1231 Encounter for screening mammogram for malignant neoplasm of breast: Secondary | ICD-10-CM | POA: Insufficient documentation
